# Patient Record
Sex: Male | Born: 1955 | Race: Black or African American | Hispanic: No | Marital: Married | Smoking: Former smoker
Health system: Southern US, Community
[De-identification: ages and names within clinical notes are randomized; demographics above are authoritative.]

## PROBLEM LIST (undated history)

## (undated) DIAGNOSIS — I219 Acute myocardial infarction, unspecified: Secondary | ICD-10-CM

## (undated) DIAGNOSIS — I739 Peripheral vascular disease, unspecified: Secondary | ICD-10-CM

## (undated) DIAGNOSIS — I2510 Atherosclerotic heart disease of native coronary artery without angina pectoris: Secondary | ICD-10-CM

## (undated) DIAGNOSIS — E785 Hyperlipidemia, unspecified: Secondary | ICD-10-CM

## (undated) DIAGNOSIS — G629 Polyneuropathy, unspecified: Secondary | ICD-10-CM

## (undated) DIAGNOSIS — E119 Type 2 diabetes mellitus without complications: Secondary | ICD-10-CM

## (undated) DIAGNOSIS — I10 Essential (primary) hypertension: Secondary | ICD-10-CM

## (undated) HISTORY — PX: VASCULAR SURGERY: SHX849

## (undated) HISTORY — PX: OTHER SURGICAL HISTORY: SHX169

---

## 1994-08-14 DIAGNOSIS — I219 Acute myocardial infarction, unspecified: Secondary | ICD-10-CM

## 1994-08-14 HISTORY — DX: Acute myocardial infarction, unspecified: I21.9

## 2007-03-18 ENCOUNTER — Emergency Department: Payer: Self-pay | Admitting: Emergency Medicine

## 2007-06-12 ENCOUNTER — Ambulatory Visit: Payer: Self-pay | Admitting: Vascular Surgery

## 2008-08-24 ENCOUNTER — Ambulatory Visit: Payer: Self-pay | Admitting: Vascular Surgery

## 2010-10-03 ENCOUNTER — Ambulatory Visit: Payer: Self-pay | Admitting: Vascular Surgery

## 2013-07-22 ENCOUNTER — Ambulatory Visit: Admit: 2013-07-22 | Disposition: A | Discharge status: Home / Self Care | Payer: Self-pay | Admitting: Gastroenterology

## 2013-08-06 ENCOUNTER — Ambulatory Visit: Payer: Self-pay | Admitting: Cardiology

## 2014-02-24 ENCOUNTER — Ambulatory Visit: Payer: Self-pay

## 2014-03-05 ENCOUNTER — Ambulatory Visit: Payer: Self-pay | Admitting: Vascular Surgery

## 2014-03-05 LAB — BASIC METABOLIC PANEL
Anion Gap: 6 — ABNORMAL LOW (ref 7–16)
BUN: 28 mg/dL — ABNORMAL HIGH (ref 7–18)
Calcium, Total: 9.6 mg/dL (ref 8.5–10.1)
Chloride: 100 mmol/L (ref 98–107)
Co2: 31 mmol/L (ref 21–32)
Creatinine: 1.4 mg/dL — ABNORMAL HIGH (ref 0.60–1.30)
GFR CALC NON AF AMER: 55 — AB
GLUCOSE: 94 mg/dL (ref 65–99)
Osmolality: 279 (ref 275–301)
Potassium: 3.9 mmol/L (ref 3.5–5.1)
Sodium: 137 mmol/L (ref 136–145)

## 2014-03-19 ENCOUNTER — Ambulatory Visit: Payer: Self-pay | Admitting: Vascular Surgery

## 2014-03-19 LAB — BASIC METABOLIC PANEL
Anion Gap: 9 (ref 7–16)
BUN: 21 mg/dL — ABNORMAL HIGH (ref 7–18)
CALCIUM: 8.9 mg/dL (ref 8.5–10.1)
CO2: 27 mmol/L (ref 21–32)
CREATININE: 1.05 mg/dL (ref 0.60–1.30)
Chloride: 103 mmol/L (ref 98–107)
EGFR (African American): >60
Glucose: 104 mg/dL — ABNORMAL HIGH (ref 65–99)
Osmolality: 281 (ref 275–301)
POTASSIUM: 4 mmol/L (ref 3.5–5.1)
Sodium: 139 mmol/L (ref 136–145)

## 2014-03-20 LAB — BASIC METABOLIC PANEL
Anion Gap: 8 (ref 7–16)
BUN: 18 mg/dL (ref 7–18)
CALCIUM: 8.4 mg/dL — AB (ref 8.5–10.1)
Chloride: 104 mmol/L (ref 98–107)
Co2: 27 mmol/L (ref 21–32)
Creatinine: 1.01 mg/dL (ref 0.60–1.30)
EGFR (African American): >60
Glucose: 103 mg/dL — ABNORMAL HIGH (ref 65–99)
Osmolality: 280 (ref 275–301)
Potassium: 3.9 mmol/L (ref 3.5–5.1)
Sodium: 139 mmol/L (ref 136–145)

## 2014-03-20 LAB — CBC WITH DIFFERENTIAL/PLATELET
BASOS PCT: 0.4 %
Basophil #: 0 10*3/uL (ref 0.0–0.1)
Eosinophil #: 0.4 10*3/uL (ref 0.0–0.7)
Eosinophil %: 4 %
HCT: 38.4 % — ABNORMAL LOW (ref 40.0–52.0)
HGB: 12.4 g/dL — ABNORMAL LOW (ref 13.0–18.0)
LYMPHS PCT: 14.1 %
Lymphocyte #: 1.2 10*3/uL (ref 1.0–3.6)
MCH: 26.9 pg (ref 26.0–34.0)
MCHC: 32.3 g/dL (ref 32.0–36.0)
MCV: 84 fL (ref 80–100)
Monocyte #: 0.7 x10 3/mm (ref 0.2–1.0)
Monocyte %: 8.6 %
NEUTROS ABS: 6.4 10*3/uL (ref 1.4–6.5)
NEUTROS PCT: 72.9 %
PLATELETS: 320 10*3/uL (ref 150–440)
RBC: 4.6 10*6/uL (ref 4.40–5.90)
RDW: 22.4 % — AB (ref 11.5–14.5)
WBC: 8.7 10*3/uL (ref 3.8–10.6)

## 2014-09-07 ENCOUNTER — Ambulatory Visit: Payer: Self-pay | Admitting: Vascular Surgery

## 2014-09-07 LAB — BASIC METABOLIC PANEL
Anion Gap: 5 — ABNORMAL LOW (ref 7–16)
BUN: 21 mg/dL — AB (ref 7–18)
CHLORIDE: 102 mmol/L (ref 98–107)
CREATININE: 1.23 mg/dL (ref 0.60–1.30)
Calcium, Total: 9.7 mg/dL (ref 8.5–10.1)
Co2: 33 mmol/L — ABNORMAL HIGH (ref 21–32)
EGFR (African American): >60
GLUCOSE: 103 mg/dL — AB (ref 65–99)
OSMOLALITY: 283 (ref 275–301)
Potassium: 4.5 mmol/L (ref 3.5–5.1)
Sodium: 140 mmol/L (ref 136–145)

## 2014-09-22 ENCOUNTER — Ambulatory Visit: Payer: Self-pay | Admitting: Vascular Surgery

## 2014-09-30 ENCOUNTER — Inpatient Hospital Stay: Payer: Self-pay | Admitting: Vascular Surgery

## 2014-11-19 ENCOUNTER — Inpatient Hospital Stay
Admit: 2014-11-19 | Disposition: A | Discharge status: Home / Self Care | Payer: Self-pay | Attending: Vascular Surgery | Admitting: Vascular Surgery

## 2014-11-19 LAB — BASIC METABOLIC PANEL
ANION GAP: 9 (ref 7–16)
BUN: 14 mg/dL
CHLORIDE: 102 mmol/L
Calcium, Total: 9.8 mg/dL
Co2: 27 mmol/L
Creatinine: 1.13 mg/dL
EGFR (Non-African Amer.): >60
GLUCOSE: 96 mg/dL
POTASSIUM: 4 mmol/L
Sodium: 138 mmol/L

## 2014-11-19 LAB — CBC
HCT: 46.7 % (ref 40.0–52.0)
HGB: 14.9 g/dL (ref 13.0–18.0)
MCH: 28.5 pg (ref 26.0–34.0)
MCHC: 31.9 g/dL — ABNORMAL LOW (ref 32.0–36.0)
MCV: 89 fL (ref 80–100)
PLATELETS: 339 10*3/uL (ref 150–440)
RBC: 5.24 10*6/uL (ref 4.40–5.90)
RDW: 14.3 % (ref 11.5–14.5)
WBC: 9.9 10*3/uL (ref 3.8–10.6)

## 2014-11-19 LAB — PROTIME-INR
INR: 1.4
Prothrombin Time: 17.7 secs — ABNORMAL HIGH

## 2014-11-19 LAB — APTT: Activated PTT: 46.3 secs — ABNORMAL HIGH (ref 23.6–35.9)

## 2014-11-20 LAB — CBC WITH DIFFERENTIAL/PLATELET
BASOS ABS: 0.1 10*3/uL (ref 0.0–0.1)
Basophil %: 0.7 %
EOS ABS: 0.3 10*3/uL (ref 0.0–0.7)
Eosinophil %: 3.5 %
HCT: 41.4 % (ref 40.0–52.0)
HGB: 13.3 g/dL (ref 13.0–18.0)
LYMPHS ABS: 2.5 10*3/uL (ref 1.0–3.6)
Lymphocyte %: 27.9 %
MCH: 28.6 pg (ref 26.0–34.0)
MCHC: 32.2 g/dL (ref 32.0–36.0)
MCV: 89 fL (ref 80–100)
Monocyte #: 0.9 x10 3/mm (ref 0.2–1.0)
Monocyte %: 9.4 %
NEUTROS ABS: 5.3 10*3/uL (ref 1.4–6.5)
Neutrophil %: 58.5 %
Platelet: 282 10*3/uL (ref 150–440)
RBC: 4.66 10*6/uL (ref 4.40–5.90)
RDW: 14.3 % (ref 11.5–14.5)
WBC: 9.1 10*3/uL (ref 3.8–10.6)

## 2014-11-20 LAB — HEPARIN LEVEL (UNFRACTIONATED): Anti-Xa(Unfractionated): >1.1 IU/mL (ref 0.30–0.70)

## 2014-11-20 LAB — APTT: Activated PTT: 96.6 secs — ABNORMAL HIGH (ref 23.6–35.9)

## 2014-11-21 LAB — CBC WITH DIFFERENTIAL/PLATELET
BASOS PCT: 0.1 %
Basophil #: 0 10*3/uL (ref 0.0–0.1)
EOS PCT: 4.4 %
Eosinophil #: 0.4 10*3/uL (ref 0.0–0.7)
HCT: 37.7 % — AB (ref 40.0–52.0)
HGB: 11.9 g/dL — ABNORMAL LOW (ref 13.0–18.0)
Lymphocyte #: 1.5 10*3/uL (ref 1.0–3.6)
Lymphocyte %: 18.5 %
MCH: 28.1 pg (ref 26.0–34.0)
MCHC: 31.5 g/dL — ABNORMAL LOW (ref 32.0–36.0)
MCV: 89 fL (ref 80–100)
MONO ABS: 0.6 x10 3/mm (ref 0.2–1.0)
Monocyte %: 7.9 %
Neutrophil #: 5.6 10*3/uL (ref 1.4–6.5)
Neutrophil %: 69.1 %
Platelet: 244 10*3/uL (ref 150–440)
RBC: 4.22 10*6/uL — ABNORMAL LOW (ref 4.40–5.90)
RDW: 14.2 % (ref 11.5–14.5)
WBC: 8.2 10*3/uL (ref 3.8–10.6)

## 2014-11-21 LAB — HEPARIN LEVEL (UNFRACTIONATED)
Anti-Xa(Unfractionated): 0.51 IU/mL (ref 0.30–0.70)
Anti-Xa(Unfractionated): 0.52 IU/mL (ref 0.30–0.70)

## 2014-11-21 LAB — APTT: Activated PTT: 119.3 secs — ABNORMAL HIGH (ref 23.6–35.9)

## 2014-11-22 LAB — BASIC METABOLIC PANEL
Anion Gap: 3 — ABNORMAL LOW (ref 7–16)
BUN: 8 mg/dL
CREATININE: 0.83 mg/dL
Calcium, Total: 8.7 mg/dL — ABNORMAL LOW
Chloride: 109 mmol/L
Co2: 28 mmol/L
EGFR (African American): >60
EGFR (Non-African Amer.): >60
GLUCOSE: 102 mg/dL — AB
POTASSIUM: 3.6 mmol/L
SODIUM: 140 mmol/L

## 2014-11-22 LAB — CBC WITH DIFFERENTIAL/PLATELET
BASOS PCT: 0.3 %
Basophil #: 0 10*3/uL (ref 0.0–0.1)
Eosinophil #: 0.4 10*3/uL (ref 0.0–0.7)
Eosinophil %: 5.8 %
HCT: 35.5 % — AB (ref 40.0–52.0)
HGB: 11.4 g/dL — AB (ref 13.0–18.0)
LYMPHS ABS: 1.8 10*3/uL (ref 1.0–3.6)
Lymphocyte %: 26.9 %
MCH: 28.5 pg (ref 26.0–34.0)
MCHC: 32.1 g/dL (ref 32.0–36.0)
MCV: 89 fL (ref 80–100)
MONO ABS: 0.7 x10 3/mm (ref 0.2–1.0)
Monocyte %: 10.9 %
Neutrophil #: 3.7 10*3/uL (ref 1.4–6.5)
Neutrophil %: 56.1 %
Platelet: 227 10*3/uL (ref 150–440)
RBC: 4.01 10*6/uL — AB (ref 4.40–5.90)
RDW: 14 % (ref 11.5–14.5)
WBC: 6.7 10*3/uL (ref 3.8–10.6)

## 2014-11-22 LAB — HEPARIN LEVEL (UNFRACTIONATED)
ANTI-XA(UNFRACTIONATED): 0.64 [IU]/mL (ref 0.30–0.70)
Anti-Xa(Unfractionated): 0.87 IU/mL — ABNORMAL HIGH (ref 0.30–0.70)

## 2014-11-24 ENCOUNTER — Ambulatory Visit
Admit: 2014-11-24 | Disposition: A | Discharge status: Home / Self Care | Payer: Self-pay | Attending: Vascular Surgery | Admitting: Vascular Surgery

## 2014-11-24 LAB — MRSA PCR SCREENING

## 2014-11-24 LAB — PROTIME-INR
INR: 1
Prothrombin Time: 13.8 secs

## 2014-11-24 LAB — APTT: ACTIVATED PTT: 38.7 s — AB (ref 23.6–35.9)

## 2014-11-24 LAB — HEMOGLOBIN: HGB: 12.7 g/dL — AB (ref 13.0–18.0)

## 2014-12-02 ENCOUNTER — Inpatient Hospital Stay
Admit: 2014-12-02 | Disposition: A | Discharge status: Home / Self Care | Payer: Self-pay | Attending: Vascular Surgery | Admitting: Vascular Surgery

## 2014-12-03 LAB — CBC WITH DIFFERENTIAL/PLATELET
BASOS ABS: 0 10*3/uL (ref 0.0–0.1)
Basophil %: 0.4 %
EOS ABS: 0 10*3/uL (ref 0.0–0.7)
Eosinophil %: 0.3 %
HCT: 38.9 % — ABNORMAL LOW (ref 40.0–52.0)
HGB: 12.2 g/dL — ABNORMAL LOW (ref 13.0–18.0)
Lymphocyte #: 1.2 10*3/uL (ref 1.0–3.6)
Lymphocyte %: 9.7 %
MCH: 27.8 pg (ref 26.0–34.0)
MCHC: 31.3 g/dL — ABNORMAL LOW (ref 32.0–36.0)
MCV: 89 fL (ref 80–100)
Monocyte #: 1 x10 3/mm (ref 0.2–1.0)
Monocyte %: 8.1 %
NEUTROS PCT: 81.5 %
Neutrophil #: 10.2 10*3/uL — ABNORMAL HIGH (ref 1.4–6.5)
PLATELETS: 314 10*3/uL (ref 150–440)
RBC: 4.38 10*6/uL — ABNORMAL LOW (ref 4.40–5.90)
RDW: 13.8 % (ref 11.5–14.5)
WBC: 12.5 10*3/uL — ABNORMAL HIGH (ref 3.8–10.6)

## 2014-12-03 LAB — BASIC METABOLIC PANEL
Anion Gap: 9 (ref 7–16)
BUN: 16 mg/dL
CO2: 21 mmol/L — AB
CREATININE: 0.74 mg/dL
Calcium, Total: 8.6 mg/dL — ABNORMAL LOW
Chloride: 109 mmol/L
EGFR (Non-African Amer.): >60
GLUCOSE: 135 mg/dL — AB
Potassium: 3.8 mmol/L
SODIUM: 139 mmol/L

## 2014-12-03 LAB — PROTIME-INR
INR: 1.1
PROTHROMBIN TIME: 14.4 s

## 2014-12-03 LAB — APTT: Activated PTT: 31.5 secs (ref 23.6–35.9)

## 2014-12-04 LAB — CBC WITH DIFFERENTIAL/PLATELET
BASOS PCT: 0.3 %
Basophil #: 0 10*3/uL (ref 0.0–0.1)
EOS PCT: 0.8 %
Eosinophil #: 0.1 10*3/uL (ref 0.0–0.7)
HCT: 34.7 % — ABNORMAL LOW (ref 40.0–52.0)
HGB: 11.1 g/dL — AB (ref 13.0–18.0)
Lymphocyte #: 1.1 10*3/uL (ref 1.0–3.6)
Lymphocyte %: 10 %
MCH: 28 pg (ref 26.0–34.0)
MCHC: 32.1 g/dL (ref 32.0–36.0)
MCV: 87 fL (ref 80–100)
MONO ABS: 1.2 x10 3/mm — AB (ref 0.2–1.0)
MONOS PCT: 10.3 %
NEUTROS PCT: 78.6 %
Neutrophil #: 8.8 10*3/uL — ABNORMAL HIGH (ref 1.4–6.5)
Platelet: 315 10*3/uL (ref 150–440)
RBC: 3.99 10*6/uL — ABNORMAL LOW (ref 4.40–5.90)
RDW: 14.1 % (ref 11.5–14.5)
WBC: 11.2 10*3/uL — AB (ref 3.8–10.6)

## 2014-12-04 LAB — BASIC METABOLIC PANEL
ANION GAP: 7 (ref 7–16)
BUN: 14 mg/dL
CREATININE: 0.68 mg/dL
Calcium, Total: 8.8 mg/dL — ABNORMAL LOW
Chloride: 106 mmol/L
Co2: 27 mmol/L
EGFR (Non-African Amer.): >60
Glucose: 114 mg/dL — ABNORMAL HIGH
Potassium: 3.5 mmol/L
SODIUM: 140 mmol/L

## 2014-12-05 LAB — CBC WITH DIFFERENTIAL/PLATELET
BASOS PCT: 0.3 %
Basophil #: 0 10*3/uL (ref 0.0–0.1)
EOS PCT: 3.1 %
Eosinophil #: 0.4 10*3/uL (ref 0.0–0.7)
HCT: 33.3 % — ABNORMAL LOW (ref 40.0–52.0)
HGB: 10.8 g/dL — ABNORMAL LOW (ref 13.0–18.0)
LYMPHS ABS: 1.7 10*3/uL (ref 1.0–3.6)
Lymphocyte %: 14.3 %
MCH: 28.4 pg (ref 26.0–34.0)
MCHC: 32.4 g/dL (ref 32.0–36.0)
MCV: 87 fL (ref 80–100)
Monocyte #: 1 x10 3/mm (ref 0.2–1.0)
Monocyte %: 8.5 %
NEUTROS PCT: 73.8 %
Neutrophil #: 8.6 10*3/uL — ABNORMAL HIGH (ref 1.4–6.5)
Platelet: 332 10*3/uL (ref 150–440)
RBC: 3.81 10*6/uL — AB (ref 4.40–5.90)
RDW: 14 % (ref 11.5–14.5)
WBC: 11.6 10*3/uL — AB (ref 3.8–10.6)

## 2014-12-05 LAB — BASIC METABOLIC PANEL
Anion Gap: 8 (ref 7–16)
BUN: 21 mg/dL — ABNORMAL HIGH
CALCIUM: 8.6 mg/dL — AB
Chloride: 101 mmol/L
Co2: 27 mmol/L
Creatinine: 0.76 mg/dL
EGFR (African American): >60
Glucose: 96 mg/dL
Potassium: 3.5 mmol/L
Sodium: 136 mmol/L

## 2014-12-05 LAB — HEMOGLOBIN A1C: HEMOGLOBIN A1C: 5.6 %

## 2014-12-05 NOTE — Op Note (Signed)
PATIENT NAME:  Wayne, [name] MR#:  [id number] DATE OF BIRTH:  June 22, 1956  DATE OF PROCEDURE:  03/19/2014  PREOPERATIVE DIAGNOSES:  1. Acute on chronic right lower extremity ischemia with reocclusion of recent intervention.  2. Peripheral arterial disease with ulceration, right lower extremity.  3. Diabetes.  4. Hypertension.   POSTOPERATIVE DIAGNOSES: 1. Acute on chronic right lower extremity ischemia with reocclusion of recent intervention.  2. Peripheral arterial disease with ulceration, right lower extremity.  3. Diabetes.  4. Hypertension.   PROCEDURES:  1. Ultrasound guidance for vascular access to the left femoral artery.  2. Catheter placement of the right anterior tibial and right posterior tibial arteries from the left femoral approach.  3. Selective right lower extremity angiogram.  4. Catheter directed thrombolysis with 8 mg of tPA to the right superficial femoral artery, popliteal, tibioperoneal trunk, and posterior tibial arteries.  5. Mechanical rheolytic thrombectomy to the same arteries.  6. Percutaneous transluminal angioplasty of tibioperoneal trunk and posterior tibial artery with 4-mm diameter drug-coated angioplasty balloon.  7. Percutaneous transluminal angioplasty of right anterior tibial artery with 3-mm diameter angioplasty balloon.  8. Percutaneous transluminal angioplasty of popliteal artery and entire superficial femoral artery with 6-mm diameter angioplasty balloon.  9. Viabahn stent placement to the popliteal artery and superficial femoral artery for residual stenosis and thrombosis after above procedures.  10. Viabahn stent placement to the mid and proximal superficial femoral artery x 2 for residual stenosis and thrombosis after above procedures.  11. StarClose closure device, left femoral artery.   SURGEON: Dr. Wyn Quaker.   ANESTHESIA: Local with moderate conscious sedation.   ESTIMATED BLOOD LOSS: Minimal.   INDICATION FOR PROCEDURE: [name]  with multiple issues. He has severe PAD and has undergone previous revascularizations. He presents with reocclusion of a recent revascularization with ischemic rest pain and ulceration of the right lower extremity. We are trying to continue with an attempt at limb salvage. We are going to open try to open up his lower extremity vessels and fully anticoagulate him. Risks and benefits were discussed. Informed consent was obtained.   DESCRIPTION OF PROCEDURE: The patient was brought to the vascular suite. Groins were shaved and prepped and a sterile surgical field was created. He has a known left common femoral artery occlusion. We stuck it just at the top of this occlusion with a micropuncture needle and micropuncture wire and sheath were then placed. We upsized to a 5 [place] sheath and crossed the aortic bifurcation without difficulty, advanced to the right femoral head and selective right lower extremity angiogram was then performed. This showed occlusion of the superficial femoral artery about 2 cm beyond its origin with really no reconstitution until the tibial vessels with both the anterior and posterior tibial arteries reconstitute distally. I then heparinized the patient. A 6 French Ansell sheath was placed over a [employer] wire. Crossing the reocclusion was quite simple and an advantage wire and a Kumpe catheter were advanced into the posterior tibial artery without difficulty. I treated this initially with 8 mg of tPA from the origin of the right superficial femoral artery throughout the popliteal artery down to the tibioperoneal trunk and posterior tibial arteries. This was allowed to dwell for about 10 minutes. Mechanical rheolytic thrombectomy was then performed over the same vessels from the posterior tibial artery, tibioperoneal trunk, popliteal artery, and superficial femoral artery. Despite this, there was still residual stenosis and thrombosis. The tibioperoneal trunk and posterior tibial  arteries were treated with a 4-mm diameter  Lutonix drug-coated angioplasty balloon. The proximal SFA was treated with a 6-mm diameter Lutonix drug-coated angioplasty balloon. The remainder of the SFA and popliteal were treated with 6-mm diameter angioplasty balloon. Following this, there was still significant residual thrombus and stenosis. The proximal SFA had a dissection with thrombus and stenosis about 3 or 4 cm beyond the origin of the SFA. There was a stent fracture in the mid SFA with residual stenosis and dissection at this location and the popliteal artery had significant residual stenosis and dissection. Despite this, there was still significant residual areas with the proximal SFA lesion in the mid SFA stent fracture and in the popliteal with multiple separate and distinct areas of the long segments and thrombus intervening, I elected to cover the entire area with Viabahn covered stents. We went from just below the knee in the popliteal artery to the mid SFA with the original Viabahn stent. A second and then a third Viabahn stent were used to cover the mid and proximal SFA and in the proximal SFA up to about 2 cm beyond the origin of the SFA. This was postdilated with a 6-mm balloon with markedly improved flow. At this point, the anterior tibial artery had significant flow limitation in the proximal segment from stenosis and likely thrombus as well. I was able to use a Kumpe catheter and get a 0.018 wire down in the anterior tibial artery and treat this with a 3-mm diameter angioplasty balloon with significantly improved flow. At this point, I felt all we could do was anticoagulate the patient and we began an Aggrastat drip. The StarClose closure device was deployed in the usual fashion in the left femoral artery with excellent hemostatic result. The patient tolerated the procedure well and was taken to the recovery room in stable condition.    ____________________________ Annice NeedyJason S. Gonzalo Waymire,  MD jsd:lt D: 03/19/2014 15:01:37 ET T: 03/19/2014 20:27:12 ET JOB#: [id number]  cc: Annice NeedyJason S. Evolet Salminen, MD, <Dictator> Annice NeedyJASON S Ski Polich MD ELECTRONICALLY SIGNED 03/25/2014 14:27

## 2014-12-05 NOTE — Op Note (Signed)
PATIENT NAME:  Wayne [name], Wayne [name] MR#:  [id number] DATE OF BIRTH:  16-Oct-1955  DATE OF PROCEDURE:  03/05/2014  PREOPERATIVE DIAGNOSES:  1.  Peripheral artery disease with ulceration, right lower extremity.  2.  Diabetes.  3.  Hypertension.   POSTOPERATIVE DIAGNOSES: 1.  Peripheral artery disease with ulceration, right lower extremity.  2.  Diabetes.  3.  Hypertension.   PROCEDURES PERFORMED: 1.  Ultrasound guidance for vascular access to left femoral artery.  2.  Catheter placement to right posterior tibial artery from left femoral approach.  3.  Aortogram and selective right lower extremity angiogram.  4.  Percutaneous transluminal angioplasty of right tibioperoneal trunk and posterior tibial artery with 3 mm diameter conventional and 4 mm diameter drug-coated angioplasty balloon.  5.  Percutaneous transluminal angioplasty of right popliteal artery and distal SFA with 4 mm diameter drug-coated angioplasty balloon.  6.  Percutaneous transluminal angioplasty of right superficial femoral artery in the mid and proximal segments with 4 and 5 mm diameter drug-coated angioplasty balloons.  7.  Percutaneous transluminal angioplasty of right proximal superficial femoral artery to common femoral artery with 4 mm diameter drug-coated angioplasty balloon and 5 mm diameter conventional angioplasty balloon.  8.  Self-expanding stent placement x2 to the proximal to mid superficial femoral artery for greater than 50% residual stenosis and dissection after angioplasty.  9.  StarClose closure device, right femoral artery.   SURGEON: Annice Needy, M.D.   ANESTHESIA: Local with moderate conscious sedation.   ESTIMATED BLOOD LOSS: 25 mL.   INDICATION FOR PROCEDURE: This is a 59 year old gentleman who has extensive history of peripheral vascular disease. I have previously treated him in the past for this and he returns now with a nonhealing ulceration of the right foot. His ABIs dropped to the 0.4 range. He has this  nonhealing ulceration and we discussed this is a critical limb threatening situation and recommended revascularization. Risks and benefits were discussed and informed consent was obtained.   DESCRIPTION OF PROCEDURE: The patient is brought to the vascular suite. The groins were shaved and prepped and a sterile surgical field was created. The left femoral head was localized with fluoroscopy. 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 Under ultrasound guidance, I accessed just above the occlusion, in the proximal common femoral artery to distal external iliac artery, without difficulty with a micropuncture needle and micropuncture wire and sheath were placed and I upsized to a 5-French sheath. A pigtail catheter was placed into the aorta at the L1-L2 level and AP aortogram was performed. This showed widely patent renal arteries. The aorta was patent without flow-limiting stenosis. The iliac segments did not have flow-limiting stenosis. I then crossed the aortic bifurcation and advanced to the right femoral head and selective right lower extremity angiogram was then performed. This demonstrated a proximal SFA stenosis that was about 90%. The artery then normalized for several centimeters and there was an occlusion of the SFA. This had slight reconstitution in the popliteal artery, but then the popliteal artery again was occluded, and he then reconstituted down in the anterior tibial artery and the posterior tibial artery. The anterior tibial artery was continuous and was likely the best vessel to the foot, and the posterior tibial artery had separate stenosis in its proximal portion, but then was a good vessel continuous to the foot distally. The patient was systemically heparinized. A 6-French Ansell sheath was placed over a [employer] wire. I began to work. The initial  SFA stenosis was easily crossed. However, the SFA occlusion was a little more complex. I had to exchange  for a CXI catheter to get through the SFA and re-enter in the popliteal artery, again reaching another occlusion and advancing down into the tibioperoneal trunk region. In this area, I was able to cross the stenosis, in the posterior tibial artery, and confirm intraluminal flow in the distal posterior tibial artery. At this point, I exchanged for an 0.018 wire, treated the posterior tibial stenosis and tibioperoneal trunk and distal popliteal artery with a 3 mm diameter angioplasty balloon with a suboptimal result. He had generous vessels down here and I then exchanged for a 4 mm diameter Lutonix drug-coated angioplasty balloon in the proximal posterior tibial artery, tibioperoneal trunk and distal popliteal artery. A 4 mm diameter angioplasty balloon was also used to treat the popliteal artery at and above the knee all the way up through the SFA. The mid to distal SFA and above-knee popliteal artery were treated with a 5 mm diameter drug-coated angioplasty balloon with a good angiographic completion result. The posterior tibial artery and tibioperoneal trunk also had a good result to angioplasty. The proximal SFA stenosis and the leading edge of the initial occlusion were treated first with a 4 mm diameter Lutonix drug-coated angioplasty balloon and then a 5 mm diameter conventional angioplasty balloon. The initial SFA stenosis responded well to angioplasty. However, the leading edge of the SFA occlusion, in the proximal and mid SFA, had residual dissection and stenosis of greater than 50%. I initially placed a 6 x 10 self-expanding stent. However, there was still residual dissection proximal to this and an additional 6 x 6 self-expanding stent was placed up into the proximal superficial artery, still about 5 cm beyond the origin of the SFA. At this point, they were post dilated with a 5 mm balloon with an excellent angiographic completion result with markedly improved flow distally, and I elected to terminate the  procedure. StarClose closure device was deployed on the left with excellent hemostatic result. The patient tolerated the procedure well and was taken to the recovery room in stable condition.   ____________________________ Annice NeedyJason S. Hanford Lust, MD jsd:sb D: 03/05/2014 10:56:44 ET T: 03/05/2014 11:05:56 ET JOB#: [id number]  cc: Annice NeedyJason S. Eliakim Tendler, MD, <Dictator> Annice NeedyJASON S Elen Acero MD ELECTRONICALLY SIGNED 03/24/2014 14:09

## 2014-12-07 LAB — CBC WITH DIFFERENTIAL/PLATELET
Basophil #: 0.1 10*3/uL (ref 0.0–0.1)
Basophil %: 0.5 %
EOS ABS: 0.7 10*3/uL (ref 0.0–0.7)
Eosinophil %: 6.1 %
HCT: 37.1 % — AB (ref 40.0–52.0)
HGB: 12.1 g/dL — ABNORMAL LOW (ref 13.0–18.0)
LYMPHS ABS: 1.9 10*3/uL (ref 1.0–3.6)
Lymphocyte %: 16.8 %
MCH: 28 pg (ref 26.0–34.0)
MCHC: 32.5 g/dL (ref 32.0–36.0)
MCV: 86 fL (ref 80–100)
Monocyte #: 1.1 x10 3/mm — ABNORMAL HIGH (ref 0.2–1.0)
Monocyte %: 9.8 %
Neutrophil #: 7.7 10*3/uL — ABNORMAL HIGH (ref 1.4–6.5)
Neutrophil %: 66.8 %
PLATELETS: 501 10*3/uL — AB (ref 150–440)
RBC: 4.3 10*6/uL — ABNORMAL LOW (ref 4.40–5.90)
RDW: 13.5 % (ref 11.5–14.5)
WBC: 11.5 10*3/uL — AB (ref 3.8–10.6)

## 2014-12-07 LAB — BASIC METABOLIC PANEL
Anion Gap: 10 (ref 7–16)
BUN: 27 mg/dL — ABNORMAL HIGH
CO2: 28 mmol/L
Calcium, Total: 9.1 mg/dL
Chloride: 99 mmol/L — ABNORMAL LOW
Creatinine: 0.88 mg/dL
EGFR (Non-African Amer.): >60
GLUCOSE: 113 mg/dL — AB
POTASSIUM: 3.8 mmol/L
Sodium: 137 mmol/L

## 2014-12-07 LAB — SURGICAL PATHOLOGY

## 2014-12-08 LAB — BASIC METABOLIC PANEL
Anion Gap: 6 — ABNORMAL LOW (ref 7–16)
BUN: 22 mg/dL — ABNORMAL HIGH
CO2: 28 mmol/L
Calcium, Total: 8.6 mg/dL — ABNORMAL LOW
Chloride: 103 mmol/L
Creatinine: 0.83 mg/dL
EGFR (African American): >60
EGFR (Non-African Amer.): >60
Glucose: 94 mg/dL
POTASSIUM: 3.7 mmol/L
SODIUM: 137 mmol/L

## 2014-12-10 LAB — CBC WITH DIFFERENTIAL/PLATELET
BASOS ABS: 0 10*3/uL (ref 0.0–0.1)
BASOS ABS: 0.1 10*3/uL (ref 0.0–0.1)
BASOS PCT: 0.4 %
Basophil %: 0.6 %
Eosinophil #: 0.5 10*3/uL (ref 0.0–0.7)
Eosinophil #: 0.6 10*3/uL (ref 0.0–0.7)
Eosinophil %: 4.6 %
Eosinophil %: 5.3 %
HCT: 35.5 % — AB (ref 40.0–52.0)
HCT: 37.1 % — AB (ref 40.0–52.0)
HGB: 11.7 g/dL — ABNORMAL LOW (ref 13.0–18.0)
HGB: 11.9 g/dL — AB (ref 13.0–18.0)
Lymphocyte #: 2 10*3/uL (ref 1.0–3.6)
Lymphocyte #: 2 10*3/uL (ref 1.0–3.6)
Lymphocyte %: 18.5 %
Lymphocyte %: 19.4 %
MCH: 27.9 pg (ref 26.0–34.0)
MCH: 28.7 pg (ref 26.0–34.0)
MCHC: 33 g/dL (ref 32.0–36.0)
MCHC: 32 g/dL (ref 32.0–36.0)
MCV: 87 fL (ref 80–100)
MCV: 87 fL (ref 80–100)
MONO ABS: 1.2 x10 3/mm — AB (ref 0.2–1.0)
MONOS PCT: 10 %
Monocyte #: 1.1 x10 3/mm — ABNORMAL HIGH (ref 0.2–1.0)
Monocyte %: 10.9 %
NEUTROS ABS: 6.8 10*3/uL — AB (ref 1.4–6.5)
NEUTROS ABS: 7.2 10*3/uL — AB (ref 1.4–6.5)
NEUTROS PCT: 65.6 %
Neutrophil %: 64.7 %
PLATELETS: 487 10*3/uL — AB (ref 150–440)
Platelet: 480 10*3/uL — ABNORMAL HIGH (ref 150–440)
RBC: 4.08 10*6/uL — ABNORMAL LOW (ref 4.40–5.90)
RBC: 4.26 10*6/uL — AB (ref 4.40–5.90)
RDW: 13.7 % (ref 11.5–14.5)
RDW: 13.7 % (ref 11.5–14.5)
WBC: 10.6 10*3/uL (ref 3.8–10.6)
WBC: 11 10*3/uL — ABNORMAL HIGH (ref 3.8–10.6)

## 2014-12-10 LAB — BASIC METABOLIC PANEL
ANION GAP: 7 (ref 7–16)
ANION GAP: 7 (ref 7–16)
BUN: 13 mg/dL
BUN: 14 mg/dL
CALCIUM: 9.1 mg/dL
CHLORIDE: 101 mmol/L
CO2: 28 mmol/L
Calcium, Total: 9.2 mg/dL
Chloride: 101 mmol/L
Co2: 27 mmol/L
Creatinine: 0.87 mg/dL
Creatinine: 0.88 mg/dL
EGFR (Non-African Amer.): >60
Glucose: 105 mg/dL — ABNORMAL HIGH
Glucose: 99 mg/dL
Potassium: 4.2 mmol/L
Potassium: 4.2 mmol/L
Sodium: 135 mmol/L
Sodium: 136 mmol/L

## 2014-12-12 LAB — WOUND CULTURE

## 2014-12-13 NOTE — Consult Note (Signed)
PATIENT NAME:  Wayne [name], Wayne [name] MR#:  [id number] DATE OF BIRTH:  [birth year]  DATE OF CONSULTATION:  11/20/2014  PRIMARY CARE PHYSICIAN:  Marisue Ivan, MD  REFERRING PHYSICIAN: Renford Dills, MD  REASON FOR CONSULT: Medical management.   HISTORY OF PRESENT ILLNESS: This is a very pleasant 59 year old male with history of peripheral vascular disease, hypertension, diabetes. He was here approximately 6 weeks ago, in which he had extensive vascular reconstruction on his right lower extremity. He presented to the ER on 11/19/2014 with increasing right leg pain. He has had pain about 2 weeks, however, over the past few days, it has worsened. He underwent a CT angiogram today. Hospitalist was consulted for medical management.   REVIEW OF SYSTEMS:  CONSTITUTIONAL: No fever, chills, fatigue, weakness.  EYES: No blurred or double vision.  EARS AND NOSE AND THROAT: No soreness of throat or postnasal drip, snoring.  RESPIRATORY: No cough, wheezing, hemoptysis, or chronic obstructive pulmonary disease.  CARDIOVASCULAR: No chest pain, orthopnea, PND.  GASTROINTESTINAL: No diarrhea, constipation, melena, or ulcers.  GENITOURINARY: No dysuria or hematuria. ENDOCRINE: No  polyuria HEMATOLOGIC/LYMPHATIC: Positive easy bruising.  SKIN: He has pain in the right lower extremity and he says that his wound from his procedure about 6 weeks ago was oozing yesterday.  NEUROLOGICAL: No history of CVA, TIA or seizures. PSYCHOLOGICAL: No history of anxiety or depression.   PAST MEDICAL HISTORY:  1. Peripheral vascular disease, status post extensive vascular reconstruction 6 weeks ago, at which time the femoral artery was cleared and femoral to distal bypass with PTSE was placed. He was started on Xarelto.  2. History of coronary artery disease/MI in 1996.  3. Hyperlipidemia.  4. History of gout.  5. History of hypertension.  6. Type 2 diabetes   MEDICATIONS:  1. Allopurinol 100 mg daily.  2. Aspirin 81 mg  daily.  3. Metformin 500 daily.  4. Coreg 6.25 daily.  5. Xarelto 10 mg daily.  6. Lisinopril 20 mg daily.  7. Simvastatin 20 mg daily.  8. Aldactone 25 mg daily.  9. Acetaminophen oxycodone 5/325, 1 to 2 tablets every 6 hours p.r.n. pain.   FAMILY HISTORY: Positive for pacemaker.   SOCIAL HISTORY:  No tobacco, alcohol or drug use.   PAST SURGICAL HISTORY: Status post distal bypass with PTFE about 6 weeks ago.   ALLERGIES: NO KNOWN DRUG ALLERGIES.   PHYSICAL EXAMINATION:  VITAL SIGNS: Temperature is 97.4, pulse is 99, respirations 18, blood pressure 161/105, 96% on room air.  GENERAL: The patient is alert, oriented, no acute distress.  HEENT:  Head is atraumatic.  Pupils equal.  Sclerae anicteric. Mucous membranes are  moist. Oropharynx is clear.  NECK: Supple. No JVD or carotid bruits. Trachea midline.  RESPIRATORY: Normal respiratory effort. No use of accessory muscles. Clear to auscultation without crackles, rales, rhonchi, or wheezing.  CARDIOVASCULAR: Regular rate and rhythm. No murmurs, gallops, or rubs.  ABDOMEN: Bowel sounds present. Nontender, nondistended. EXTREMITIES: Right leg is cold, fleeting pulse on that right lower extremity. Left leg has 1+ femoral pulse. No rashes. He has a lateral calf incision, not well healed without any purulent discharge.  NEUROLOGIC: Cranial nerves page II through XII are grossly intact. There are no focal deficits.   LABORATORY DATA: White blood cells 9, hemoglobin 13.3, hematocrit 42, platelets are 282,000.   CT angiography on 11/19/2014 showed a recent right common femoral artery occlusion with no flow in the femoral anterior tibial graft and stented superficial femoral and popliteal arteries. The  upper profunda femoris artery is occluded, but reconstitutes and provides infrapopliteal flow via the attenuated anterior tibial artery.   Left-sided bladder wall thickening with contiguous mass. Recommend urography protocol abdominal CT clinically  feasible.   ASSESSMENT AND PLAN: This is a 59 year old male with peripheral vascular disease, status post extensive reconstruction in the right lower extremity about 6 weeks ago, who presents with an ischemic right lower extremity.   1. Ischemic right lower extremity. The patient underwent angiogram as per Dr. Lorretta HarpSchneir. Recommendations for him. The patient is currently on heparin drip, which we will continue. Xarelto has been on hold.  2. Accelerated hypertension. The patient will need good blood pressure control goals, ideally less than 130/80. The patient is on Coreg, as well as lisinopril. I will increase Coreg to 6.25 b.i.d. and monitor the blood pressure and heart rate.   3. Diabetes. I agree with holding metformin for now. The patient will continue on sliding scale insulin.  4. History of coronary artery disease. The patient will be continued on aspirin, beta blocker, statin, lisinopril and Aldactone. He has no chest pain and no symptoms active heart disease at this time.   CODE STATUS the patient is a FULL CODE STATUS.   TIME SPENT: Approximately 45 minutes.  ____________________________ Janyth ContesSital P. Juliene PinaMody, MD spm:AT D: 11/20/2014 13:56:39 ET T: 11/20/2014 14:58:29 ET JOB#: [id number]  cc: Kale Dols P. Juliene PinaMody, MD, <Dictator> Marisue IvanKanhka Linthavong, MD Janyth ContesSITAL P Carisma Troupe MD ELECTRONICALLY SIGNED 11/20/2014 15:55

## 2014-12-13 NOTE — Consult Note (Signed)
Brief Consult Note: Diagnosis: medical management.   Patient was seen by consultant.   Consult note dictated.   Orders entered.   Comments: 58yAAM pmh PVD, HTN, CAD, HLD, DM2, admit 12/02/14 by vasc surgery underwent aortabifemoral bypass, bilateral CFA and PFA thromboendarterectomy, Fogarty embolectomy to right fem-distal bypass ESBL 1200, 650mL returned eval in icu post op c/o pain at surgical site  1. PVD: defer care to primary, pain meds, add bowel reg 2. dm2: hold po, add iss q6h acu 3. htn ess: prn hydralazine, restart home meds when taking PO 4. hld: statin 5. vte px: defer to vasc full 45min.  Electronic Signatures: Riane Rung, Cletis Athensavid K (MD)  (Signed 21-Apr-16 02:00)  Authored: Brief Consult Note   Last Updated: 21-Apr-16 02:00 by Wyatt HasteHower, Twana Wileman K (MD)

## 2014-12-13 NOTE — Op Note (Signed)
PATIENT NAME:  Wayne [name], Wayne [name] MR#:  [id number] DATE OF BIRTH:  Jun 11, 1956  DATE OF PROCEDURE:  11/20/2014  PREOPERATIVE DIAGNOSES:  1.  Ischemia, right lower extremity.  2.  Complication of arterial graft, right leg with thrombosis.  3.  Atherosclerotic occlusive disease, bilateral lower extremities with rest pain.   POSTOPERATIVE DIAGNOSES:  1.  Ischemia, right lower extremity.  2.  Complication of arterial graft, right leg with thrombosis.  3.  Atherosclerotic occlusive disease, bilateral lower extremities with rest pain.  PROCEDURES PERFORMED:   1.  Introduction catheter into aorta via right femoral-to-tibial bypass graft.  2.  Angiography of the abdominal aorta.  3.  Angiography of the right lower extremity.   SURGEON: Renford Dills, MD  ACCESS: A 5 French sheath, right femoral-to-anterior tibial bypass graft.   CONTRAST USED: Isovue 40 mL.   FLUOROSCOPY TIME: 12.3 minutes.   INDICATIONS: Wayne [name] is a 59 year old gentleman who is just 6 weeks status post reconstruction of the right common femoral, profunda femoris with right femoral-to-anterior tibial bypass grafting. He presented to the Emergency Room yesterday with ischemic changes to his foot and was found to have thrombosis of his common femoral artery, origin of the profunda femoris, as well as the bypass graft. The risks and benefits for angiography with the hope for intervention for limb salvage are reviewed. All questions have been answered. The patient has had multiple interventions in the past. He agrees to proceed.   DESCRIPTION OF PROCEDURE: The patient is taken to special procedures and placed in the supine position. After adequate sedation is achieved, both groins are prepped and draped in a sterile fashion and appropriate timeout is called.   Working in the left groin, ultrasound is placed in a sterile sleeve; however, it is very difficult to identify any common femoral artery. There are several large collaterals.  There does appear to be an area very proximally, which would be consistent with proximal common femoral or even distal external iliac. Attempts at accessing this are unsuccessful and after multiple attempts and review of yesterday's CT scan, the left groin is abandoned as possible access.   Therefore, after careful consideration, review CT scan demonstrating that it does not provide adequate imaging, particularly distally, nor of the reconstitution of the profunda femoris, in order, at the very least, to obtain adequate imaging, access will be obtained through the femoral-to-anterior tibial graft; this would provide a more secure place to achieve hemostasis as opposed to through the common femoral itself given the recent surgery and the dense scar tissue.   Therefore, ultrasound was utilized to identify the graft, which was noted to be heterogeneous and filled with thrombus. Access, after 1% lidocaine was infiltrated, was obtained under direct visualization. Image was recorded for the permanent record. A microwire was advanced, micro sheath, followed by an Amplatz wire, and then a 5 [place] sheath.   Using a floppy Glidewire and a Kumpe catheter, the catheter was negotiated into the aorta; however, there was extensive dissection noted throughout, even on initial images where hand injection was performed through the sheath from the bypass graft and, in spite of multiple efforts, true lumen was obtained into the circumflex vessel but could never be renegotiated into the aorta. Given these findings, and the extensive damage and apparent increasing thrombus burden, both in the common and external iliac which is present on CT as well as now on the angiogram, considerations for surgical intervention must be rendered. Therefore, the catheter and wire were removed and  pressure was held after the sheath was removed. There is no change in the patient's status.   INTERPRETATION: The distal aorta demonstrates  opacification, although there appears to be false lumen and a true lumen there is diffuse thrombus and as well as dissection throughout the iliacs, particularly on the right, external, and common. Common femoral appears to have a dissection as well as thrombus noted within it. There is occlusion of the bypass graft. There is nonfilling of the profunda from a femoral injection.   SUMMARY: The patient with profound inflow disease as well as outflow. Possible open reconstruction must be considered; however, given the patient's recent surgical interventions, this would be a high risk procedure and the patient remains at very high risk for amputation.    ____________________________ Renford DillsGregory G. Daryn Pisani, MD ggs:bm D: 11/20/2014 19:09:26 ET T: 11/21/2014 02:09:33 ET JOB#: [id number]  cc: Renford DillsGregory G. Kadeshia Kasparian, MD, <Dictator> Renford DillsGREGORY G Zelta Enfield MD ELECTRONICALLY SIGNED 11/24/2014 15:14

## 2014-12-13 NOTE — Discharge Summary (Signed)
PATIENT NAME:  Wayne [name], Wayne [name] MR#:  [id number] DATE OF BIRTH:  1956-03-10  DATE OF ADMISSION:  09/30/2014 DATE OF DISCHARGE:  10/04/2014  DATE OF OPERATION:  09/30/2014    PREOPERATIVE DIAGNOSES: Peripheral arterial disease with rest pain, right lower extremity.   PROCEDURES PERFORMED WHILE IN THE HOSPITAL: Include a right femoral to distal bypass graft with PTFE, a right common femoral endarterectomy, a right profunda femoris endarterectomy. For full details of that, please see the dictated operative summary.   BRIEF HISTORY: This is a gentleman with extensive peripheral vascular disease who we have treated for many years. He has had multiple previous interventions. He has had these fail and still has ischemic rest pain on the right leg despite anticoagulation. We are performing surgical bypass as well as femoral endarterectomy to provide him with adequate arterial perfusion for quality functional life and limb salvage.   HOSPITAL COURSE: The patient admitted to same day surgery and taken to the Operating Room where the above-mentioned procedures performed. For full details of that, please see the dictated operative summary. He did well and went to the critical care unit overnight. He was transitioned out to the floor on postoperative day number 1 where he increased his diet and activity over the next 3 days until he was stable for discharge on postoperative day #4. At this point his pain was controlled with oral medications. He was afebrile with vital signs stable. His incisions were clean, dry, and intact. His foot was warm with a good palpable pulse.    HOME MEDICATIONS:   1.  Allopurinol 100 mg daily.  2.  Aspirin 81 mg daily.  3.  Metformin 500 mg at bedtime.  4.  Lasix 20 mg daily.  5.  Carvedilol 6.25 mg daily.  6.  Xarelto 10 mg daily.  7.  Lisinopril 20 mg daily.  8.  Simvastatin 40 mg at bedtime.  9.  Spironolactone 25 mg daily.  10.  Percocet as needed for pain.   DISCHARGE  INSTRUCTIONS: His diet will be regular. His activity will be as tolerated. His follow up will be in 1 to 2 weeks for wound check and staple removal. He will call or contact our office if any problems or change in the interim.    ____________________________ Annice NeedyJason S. Junie Avilla, MD jsd:AT D: 10/20/2014 14:39:50 ET T: 10/21/2014 00:05:01 ET JOB#: [id number]  cc: Annice NeedyJason S. Delynda Sepulveda, MD, <Dictator> Annice NeedyJASON S Sawyer Mentzer MD ELECTRONICALLY SIGNED 10/22/2014 10:34

## 2014-12-13 NOTE — Consult Note (Signed)
PATIENT NAME:  Wayne HugueninBUSH, Khylen MR#:  [id number] DATE OF BIRTH:  1955/09/22  DATE OF CONSULTATION:  11/22/2014.  CONSULTING PHYSICIAN:  Dr.  Gilda CreaseSchnier.   REASON FOR CONSULTATION:  Coronary artery disease with previous cardiovascular disease, hypertension, hyperlipidemia, and peripheral vascular disease.   CHIEF COMPLAINT:  "I have leg pain."   HISTORY OF PRESENT ILLNESS:  This is a 59 year old male with known apparent previous coronary artery disease and old myocardial infarction who has had essential hypertension, mixed hyperlipidemia, diabetes, and severe peripheral vascular disease from previous tobacco abuse. The patient has had new-onset right leg pain at rest with significant peripheral vascular disease requiring further intervention and possible surgical intervention.  He has not had any congestive heart failure or angina with the physical activity he does and has had no recent particular cardiovascular symptoms.  The patient also has had appropriate medication management for risk factors of cardiovascular disease, including excellent blood pressure control and lipid management with diabetes improving over time.  The patient has been admitted for peripheral vascular issues and has been on anticoagulation for these reasons.    REMAINDER OF REVIEW OF SYSTEMS:  Negative for vision change, ringing in the ears, hearing loss, cough, congestion, heartburn, nausea, vomiting, diarrhea, bloody stools, stomach pain.  Positive for extremity pain, leg weakness.  Negative for syncope, dizziness, diaphoresis, skin lesions, skin rashes, depression, anxiety.   PAST MEDICAL HISTORY:  1. Peripheral vascular disease.  2. Diabetes with complications.  3. Essential hypertension.  4. Mixed hyperlipidemia.  5. Coronary atherosclerosis without angina.   FAMILY HISTORY:  Positive for pacemaker.   SOCIAL HISTORY:  Currently denies alcohol or tobacco use.   ALLERGIES :  As listed.   MEDICATIONS:  As listed.   PHYSICAL  EXAMINATION:  VITAL SIGNS:  Blood pressure is 126/68 bilaterally.  Heart rate is 72 upright, reclining, and regular.  GENERAL:  He is a well-appearing male in no acute distress.  HEAD, EYES, EARS, NOSE AND THROAT:  No icterus, thyromegaly, ulcers, hemorrhages, or xanthelasma.  CARDIOVASCULAR:  Regular rate and rhythm with normal S1 and S2 without murmur, gallop, or rub.  PMI is inferiorly displaced.  Carotid upstroke normal without bruit.  Jugular venous pressure normal.  LUNGS:  A few basilar crackles with normal respirations.  ABDOMEN:  Soft, nontender, without hepatosplenomegaly or masses.  Abdominal aorta is normal size.  EXTREMITIES:  Show 2+ radial, trace femoral, and no dorsal pedal pulses, with trace lower extremity edema.  No cyanosis, clubbing, or ulcers.  NEUROLOGIC:  He is oriented to time, place, and person, with normal mood and affect.   ASSESSMENT:  A 59 year old male with coronary artery disease stable at this time without evidence of congestive heart failure or angina with essential hypertension, mixed hyperlipidemia, and diabetes, all well controlled with medication management, with acute-onset peripheral vascular disease complications and symptoms needing further intervention at lowest risk possible for cardiovascular complication with surgery.   RECOMMENDATIONS:  1. Continue ACE inhibitor, beta blocker for further risk reduction in cardiovascular event with surgical intervention.  2. Proceed to surgery without restriction perioperatively and/or with recovery due to no current symptoms of congestive heart failure or angina.  3. Continue mixed hyperlipidemia treatment with high intensity cholesterol therapy.  4. Hypertension control with medications listed above.  5. No further cardiac diagnostic necessary at this time.    ____________________________ Lamar BlinksBruce J. Kowalski, MD bjk:kc D: 11/22/2014 07:26:43 ET T: 11/22/2014 07:58:01 ET JOB#: [id number]  cc: Lamar BlinksBruce J. Kowalski, MD,  <Dictator> Quentin CornwallBRUCE J  Gwen Pounds MD ELECTRONICALLY SIGNED 12/02/2014 8:57

## 2014-12-13 NOTE — Consult Note (Signed)
PATIENT NAME:  Wayne [name], Wayne [name] MR#:  [id number] DATE OF BIRTH:  01/17/1956  DATE OF CONSULTATION:  12/03/2014  REFERRING PHYSICIAN:  Renford DillsGregory G. Schnier, MD  CONSULTING PHYSICIAN:  Cletis Athensavid K. Yonathan Perrow, MD  PRIMARY CARE PHYSICIAN:  Marisue IvanKanhka Linthavong, MD, [facility]    REASON FOR CONSULT:  Medical management.    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 Estimated blood loss of 1200 mL with 650 mL of blood returned via Cell Saver. The patient is now being evaluated in the ICU postoperatively. Currently, he is only complaining of some pain as expected at the surgical site. Otherwise, no further complaints.   REVIEW OF SYSTEMS:   CONSTITUTIONAL: Denies fevers, chills, fatigue, weakness.  EYES: Denies blurred vision, double vision, eye pain.  EARS, NOSE, THROAT: Denies tinnitus, ear pain, hearing loss.  RESPIRATORY:  Denies cough, wheeze, shortness of breath.    CARDIOVASCULAR: Denies chest pain, palpitations, edema.  GASTROINTESTINAL: Denies nausea, vomiting, diarrhea, abdominal pain.  GENITOURINARY: Denies dysuria or hematuria.  ENDOCRINE: Denies nocturia or thyroid problems. HEMATOLOGIC AND LYMPHATIC:  Denies easy bruising or bleeding.  SKIN: Denies rash or lesion.  MUSCULOSKELETAL: Denies pain in neck, back, shoulder, knees, hips or arthritic symptoms.  NEUROLOGIC: Denies paralysis, paresthesias.  PSYCHIATRIC: Denies anxiety or depressive symptoms.  Otherwise full review of systems performed by me is negative.    PAST MEDICAL HISTORY: Includes peripheral  vascular disease as stated above, coronary artery disease without angina, hyperlipidemia, unspecified, hypertension essential, type 2 diabetes complicated by peripheral vascular disease, non-insulin-requiring as well as history of gout.   SOCIAL HISTORY: No alcohol, tobacco, drug usage.   FAMILY HISTORY: No known cardiovascular or pulmonary disorders.   ALLERGIES: No known drug allergies.   HOME MEDICATIONS: Include allopurinol 100 mg p.o. q. daily, aspirin 81 mg p.o. q. daily, Coreg 6.25 mg p.o. daily, lisinopril 20 mg p.o. q. daily, metformin extended release 500 mg p.o. at bedtime, oxycodone 20 mg p.o. q. 6 hours as needed for pain, simvastatin 40 mg p.o. at bedtime, spironolactone 25 mg p.o. daily, Xarelto 10 mg p.o. q. daily.   PHYSICAL EXAMINATION:  VITAL SIGNS: Temperature 97.6, heart rate of 87, respirations 21, blood pressure 166/100, currently 141/87, saturating 96% on room air, weight 84.8 kg, BMI 27.6.  GENERAL: Well-nourished, well-developed African-American gentleman currently in minimal distress given pain.  HEAD: Normocephalic, atraumatic.  EYES: Pupils equal, round, react to light. Extraocular muscles intact. No scleral icterus.  MOUTH: moist mucosal membrane. Dentition intact. No abscess noted.  EAR, NOSE, THROAT: Clear without exudates. No external lesions.  NECK: Supple. No thyromegaly. No nodules. No JVD.  PULMONARY: Clear to auscultation bilaterally without wheezes, rales, or rhonchi. No use of accessory muscles. Good respiratory effort.  CHEST: Nontender to palpation.  CARDIOVASCULAR: S1, S2, regular rate and rhythm. No murmurs, rubs, or gallops. No edema. Pedal pulses diminished bilaterally. GASTROINTESTINAL: Soft, nontender, nondistended. No masses. Positive bowel sounds. No hepatosplenomegaly.  MUSCULOSKELETAL: No swelling, clubbing, edema. Range of motion full in all extremities.  NEUROLOGIC: Cranial nerves II through XII intact. No gross focal neurological deficit.  Sensation intact. Reflexes intact.  SKIN: No ulceration, lesions, rash, cyanosis. Surgical sites appear intact. No erythema  or drainage noted.  PSYCHIATRIC: Mood and affect within normal limits. The patient is awake, alert, and oriented x 3. Insight and judgment intact.   LABORATORY DATA:  glucose of 127, otherwise, no other labs were performed on this admission.   ASSESSMENT AND PLAN: A 59 year old African-American gentleman with history of peripheral vascular disease, hypertension, essential, coronary artery disease, type 2 diabetes, non-insulin-requiring originally admitted on 12/02/2014 by vascular surgery, underwent an aortobifemoral bypass being consulted for routine medical management.   1.  Peripheral vascular disease. Defer care to primary service of vascular surgery, however, continue with pain medications as required. We will also add a bowel regimen at this time to help avoid postoperative constipation.  2.  Type 2 diabetes complicated by peripheral vascular disease. Hold p.o. agents, add insulin sliding scale, q. 6 hour Accu-Cheks.  3.  Hypertension, essential. Restart home medications when able to take p.o. In the meantime initiate p.r.n. hydralazine. If unable to tolerate p.o. by the morning, can have a scheduled beta blocker IV.   4.  Hyperlipidemia, unspecified, statin therapy.  5.  Venous thromboembolic prophylaxis. We will defer to vascular surgery.    CODE STATUS: The patient is full code.   TIME SPENT: 45 minutes.     ____________________________ Cletis Athens. Kristyana Notte, MD dkh:AT D: 12/03/2014 02:07:06 ET T: 12/03/2014 03:04:13 ET JOB#: [id number]  cc: Cletis Athens. Mousa Prout, MD, <Dictator> Jachai Okazaki Synetta Shadow MD ELECTRONICALLY SIGNED 12/03/2014 20:34

## 2014-12-13 NOTE — Op Note (Signed)
PATIENT NAME:  Wayne [name], Wayne [name] MR#:  [id number] DATE OF BIRTH:  29-Mar-1956  DATE OF PROCEDURE:  09/30/2014  PREOPERATIVE DIAGNOSES: 1.  Peripheral arterial disease with rest pain, right lower extremity.  2.  Status post multiple previous percutaneous interventions, right lower extremity.  3.  Diabetes.  4.  Hypertension.  5.  Hyperlipidemia.   POSTOPERATIVE DIAGNOSES: 1.  Peripheral arterial disease with rest pain, right lower extremity.  2.  Status post multiple previous percutaneous interventions, right lower extremity.  3.  Diabetes.  4.  Hypertension.  5.  Hyperlipidemia.   PROCEDURES: 1.  Right common femoral and superficial femoral endarterectomy.  2.  Right profunda femoris endarterectomy.  3.  Right femoral to anterior tibial bypass with 6 mm Distaflo graft.   SURGEONS:  Annice NeedyJason S. Dew, MD, and Renford DillsGregory G. Schnier, MD  ANESTHESIA:  General.   BLOOD LOSS:  400 mL.   INDICATION FOR PROCEDURE:  This is a gentleman well known to me for his chronic peripheral arterial disease. He has required multiple previous interventions but still has had recurrent occlusions and has ischemic rest pain. He is now brought in for surgical bypass in hopes of limb salvage. The risks and benefits were discussed. Informed consent was obtained.   DESCRIPTION OF PROCEDURE:  The patient was brought to the operative suite, and after an adequate level of general anesthesia was obtained, the right lower extremity was sterilely prepped and draped and a sterile surgical field was created. The femoral incision was created in typical fashion vertical incision I dissected out the femoral artery while Dr. Gilda CreaseSchnier was dissecting out the anterior tibial artery. He dissected this out, encircling with vessel loops proximally and distally. I got the femoral artery dissected out proximally at the distal external iliac artery, the SFA just above the previously placed stent, several centimeters beyond the origin. The profunda  femoris artery was then dissected out beyond the primary branches and into the secondary branches, which were controlled with vessel loops. We then made a small counter incision in the medial calf and tunneled a 6 mm Distaflo graft. After tunneling, we gave 5000 units of intravenous heparin. The distal anastomosis had to be created first, leaving a small hole to flush out of due to the configuration of the Distaflo graft. This was done with CV-6 sutures in the usual fashion. The anterior tibial artery was controlled with vessel loops proximally and distally and an antral arteriotomy was created with an 11 blade and extended with Potts scissors to match the Distaflo graft. Again, the anastomosis was created and a small opening was left to flush through. The graft was then pulled taut to an appropriate length and brought up to prepare an anastomosis to the distal common femoral artery and profunda femoris artery. The femoral artery was controlled and the profunda, superficial femoral and common femoral arteries with vessel loops. An arteriotomy was created in the mid-to-distal main profunda femoris artery and taken up into the common femoral artery. This was a dense, thick plaque and an endarterectomy had to be performed. An eversion endarterectomy was performed on the superficial femoral artery. The elevator was used to get a good plane in the common femoral artery, and we used also an eversion endarterectomy on the proximal and the common femoral artery with excellent pulsatile flow and no loose flecks remaining. We then turned our attention down to the profunda femoris artery. A small medial branch had an eversion endarterectomy performed, the main profunda femoris down to the secondary  bifurcation after the small medial branch. Endarterectomy was performed with the [facility]. A nice feathered distal endpoint was created with gentle traction in an eversion fashion on the main branch. The more lateral branch  was also performed with eversion endarterectomy until all large bulky plaque was removed. There was brisk backbleeding through all profunda femoris branches at this point. The graft was then cut and beveled to an appropriate length to match the arteriotomy. An anastomosis was created with a running CV-5 suture proximally. We flushed distally and then completed the anastomosis distally. A single CV-6 suture distally was used for hemostasis. The wound was irrigated. Surgicel and Evicel topical hemostatic agents were placed and hemostasis was achieved. We then closed the groin incisions with two layers of 2-0 Vicryl, two layers of 3-0 Vicryl, and staples. The medial and lateral calf incisions were closed with 3-0 Vicryl and staples. The patient was then awakened from anesthesia and taken to the recovery room in stable condition, having tolerated the procedure well.    ____________________________ Annice Needy, MD jsd:nb D: 09/30/2014 16:35:20 ET T: 10/01/2014 03:24:31 ET JOB#: [id number]  cc: Annice Needy, MD, <Dictator> Annice Needy MD ELECTRONICALLY SIGNED 10/07/2014 16:40

## 2014-12-13 NOTE — Discharge Summary (Signed)
PATIENT NAME:  Wayne [name], [name] MR#:  [id number] DATE OF BIRTH:  [birth year]  DATE OF ADMISSION:  11/19/2014 DATE OF DISCHARGE:  11/22/2014  DISCHARGE DIAGNOSIS: Ischemia of right lower extremity.   SECONDARY DIAGNOSES: Coronary artery disease, hyperlipidemia,  gout, hypertension,   PROCEDURES PERFORMED: Angiography of the abdominal aorta and right lower extremity, 11/20/2014.   CONSULTATIONS: Dr. Gwen PoundsKowalski, cardiology; Dr. Juliene PinaMody, medical management.   BRIEF HISTORY: Wayne [name] is a 59 year old gentleman who presented to the Emergency Room on April 7 with right leg pain. He had recently undergone right femoral to and distal bypass with prosthetic. He had noted a breakdown of the incision in the lateral calf as well. On physical examination, his right leg was ischemic and radiology studies confirmed occlusion of his bypass.   HOSPITAL COURSE: On the day of admission, he was started on heparin drip. He was given parenteral pain medication to control his pain. Subsequently, underwent angiography on April 8. This situation demonstrated significant inflow disease in the aorta iliac distribution and therefore no further interventions were performed. He was not a candidate for thrombolysis given his recent surgery and, therefore, plans were made for an aortobifemoral bypass. Given this, cardiology consult as well as medical consult was obtained on hospital day #3. He had been cleared medically and from cardiology point of view. He, therefore, is scheduled for surgery and is discharged to home. He is to continue his Xarelto for 1 day. He will stop Xarelto on Monday. He is to continue his other home medications.    ACTIVITIES: As tolerated.   DIET: ADA, carbohydrate-controlled.   FOLLOWUP: He will follow up on the day of surgery as a postoperative admission.    ____________________________ Renford DillsGregory G. Schnier, MD ggs:bm D: 12/08/2014 18:01:22 ET T: 12/09/2014 06:19:31 ET JOB#: [id number]  cc: Renford DillsGregory G. Schnier,  MD, <Dictator> Renford DillsGREGORY G SCHNIER MD ELECTRONICALLY SIGNED 12/11/2014 13:05

## 2014-12-13 NOTE — Op Note (Signed)
PATIENT NAME:  Wayne [name], Wayne [name] MR#:  [id number] DATE OF BIRTH:  December 11, 1955  DATE OF PROCEDURE:  12/02/2014  PREOPERATIVE DIAGNOSES:  1. Atherosclerotic occlusive disease, bilateral lower extremities, with rest pain and ulceration of the right lower extremity.  2. Distal aortic occlusion.  3. 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   POSTOPERATIVE DIAGNOSES: 1. Atherosclerotic occlusive disease, bilateral lower extremities, with rest pain and ulceration of the right lower extremity.  2. Distal aortic occlusion.  3. 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   PROCEDURES PERFORMED:  1. Aortobifemoral bypass grafting with 14 x 7 bifurcated Dacron graft.  2. Common femoral thromboendarterectomy, right side.  3. Common femoral thromboendarterectomy, left side.  4. Thromboembolectomy of the profunda femoris, right side.  5. Thromboembolectomy of the profunda femoris, left side.  6. Fogarty embolectomy of the right femoral to anterior tibial bypass graft.   CO-SURGEONS:  Dr. Gilda CreaseSchnier and Dr. Wyn Quakerew.     ANESTHESIA:  General by endotracheal intubation.   FLUIDS:  Per anesthesia record.   ESTIMATED BLOOD LOSS:  Approximately 1200 mL with 650 mL returned in Cell Saver.   SPECIMEN:  Thrombus and plaque from both the right femoral arteries as well as the left femoral arteries and thrombus from femoral distal bypass graft; all sent as separate specimens.    INDICATIONS:  Wayne [name] is a 59 year old gentleman who has been having increased pain as well as an ulceration on the right lateral calf.  Workup has demonstrated distal aortic occlusion, and the patient is therefore undergoing bypass surgery with endarterectomies to allow for optimal outflow.  This is being performed in an attempt to salvage his right leg.  The risks and benefits as well as the threat of limb loss have been discussed in detail with the patient, and all questions have been answered.  The patient agrees to proceed.    DESCRIPTION OF PROCEDURE:  The patient is taken to the operating room, placed in the supine position.  After adequate general anesthesia is induced, appropriate invasive monitors are placed.  A central line is placed by Dr. Wyn Quakerew and will be dictated as a separate procedure by him.  He is then prepped and draped in a sterile fashion from the nipple line down to the knees. Appropriate timeout is called.   Working initially on the left groin, a vertical incision is created and the dissection is carried down through the soft tissues to expose the femoral sheath which is then opened longitudinally. The dissection is rather difficult and meticulous secondary to fairly dense fibrotic scarring from the multiple StarClose devices deployed in the past.  Ultimately both the common femoral at origin of the SFA and the profunda femoris down to the quaternary branches is dissected and  Silastic vessel loops were placed around each of these individual branches.  Saline-moistened  sponge is placed in the wound.   Attention is then turned to the right groin which is re-incised through the previous incisional scar.  The dissection is then carried down to expose the bypass which is then used to guide the dissection back to the common femoral.  The common femoral is noted to be occluded as is the left, and subsequently the dissection is carried out to expose the profunda femoris again at the fourth-order branches looping these individually.  SFA origin as well as the graft origin and the common femoral are also looped.  A saline-moistened sponge is then placed in the wound.   A midline incision is then  created and the peritoneal cavity is entered without difficulty.   Viscera are inspected and then swept into the right gutter and packed away with a saline-moistened surgical towel.  Omni-Tract retractor system is then placed in the wound to allow  exposure of the retroperitoneum.   Retroperitoneum is then opened vertically  and the dissection is carried down to expose the renal vein.  The aorta is then identified and dissected circumferentially at this level.  It appears soft and strongly pulsatile.  Dissection is then carried along the anterior margin of the aorta distally exposing the IMA which is dissected circumferentially and looped with a Silastic vessel loop. Below the level of the IMA, the aorta becomes much more firm with a bluer discoloration consistent with thrombosis.   Tunnels are then created from the aortic bifurcation under the retroperitoneal tissues and out the groins, and umbilical tapes are placed, both right and left.   With Dr. Wyn Quaker on the left and myself on the right, 5000 units of heparin is given.  A 14 x 7 bifurcated aortic graft is selected, and after 5 minutes of heparinization, the aorta is clamped.  Aortotomy is made, extended with Potts scissors.  A 5-0 Prolene stay suture is then placed to allow for exposure.  The graft is beveled, and an end graft to side aorta anastomosis is fashioned with running 3-0 Prolene with Dr. Wyn Quaker sewing on the left side of the anastomosis and myself on the right.  Flushing maneuvers are performed, and the anastomosis is noted to be hemostatic.  The graft itself is then clamped just above the suture line and irrigated with heparinized saline. The left limb is then pulled through the tunnel and it is verified to be straight.  The distal common femoral and profunda femoris are then opened using an 11 blade to make the arteriotomy and then extending it with the Potts scissors.  The arteriotomy is extended down to the fourth-order branches.  The graft is then beveled, and an end graft to side distal common femoral and profunda femoris anastomosis is fashioned using running 5-0 Prolene.  Flushing maneuvers are performed.  One small bleeding area is controlled with a single 5-0 Prolene. Otherwise, the anastomosis is hemostatic.  Flow is then re-established to the left lower  extremity through the profunda branches.  Endarterectomy is also performed of the SFA, although by palpation it likely occludes more distally.   The right limb of the graft is then pulled through the retroperitoneal tunnel.  The graft is inspected for its straightness and tension.  Arteriotomy is made in the common femoral and then extended down again to approximately the fourth-order branches.  The graft is beveled, and an end graft to side common femoral and profunda femoris anastomosis is fashioned.  Prior to doing this, a #3 Fogarty is passed down the femoral distal Gore-Tex graft.  A large amount of thrombus is retrieved.  Minimal backbleeding is noted.  It is then irrigated with heparinized saline.  As noted, once this has been accomplished, an end graft to side common femoral to profunda femoris anastomosis is fashioned with running 6-0 Prolene.  The anastomosis is inspected for hemostasis.  Left side is rechecked as is the proximal, and then the Surgicel and Evicel are placed in the retroperitoneal area and the retroperitoneum is reapproximated with running 0 Vicryl.  The viscera are returned to their anatomic location.  They are inspected and found to be pink and appear to be peristalsing normally.  Omentum  is then placed in front, and the fascia is reapproximated with running looped #1 PDS with interspersed [place] knots.   Again working with Dr. Wyn Quaker on the left and myself on the right, the groin incisions are reconstructed in multiple layers using 0 Vicryl, 2-0 Vicryl, and 3-0 Vicryl.  Five layers are used on the left, four layers used on the right.  All skin incisions are then closed with surgical staples and sterile dressings are applied.   The patient tolerated the procedure well.  There were no episodes of hemodynamic instability, and he was taken to the recovery area in stable condition.    ____________________________ Renford Dills, MD ggs:kc D: 12/02/2014 20:48:12  ET T: 12/02/2014 21:35:58 ET JOB#: [id number]  cc: Renford Dills, MD, <Dictator> Renford Dills MD ELECTRONICALLY SIGNED 12/08/2014 12:49

## 2014-12-13 NOTE — Op Note (Signed)
PATIENT NAME:  Wayne [name], Wayne [name] MR#:  [id number] DATE OF BIRTH:  03-27-1956  DATE OF PROCEDURE:  12/02/2014  PREOPERATIVE DIAGNOSES:  1.  Peripheral arterial disease with ulceration of the right calf and rest pain right lower extremity. 2.  Peripheral arterial disease with claudication, left lower extremity.  3.  Thrombosed right femoral to distal bypass.  4.  Hyperlipidemia.  5.  Hypertension.   PROCEDURES:   1.  Aortobifemoral bypass with a 14 x 7 Dacron graft.  2.  Re-exploration right groin.  3.  Right common femoral artery and profunda femoris artery thromboendarterectomies.  4.  Fogarty embolectomy right femoral to distal bypass graft.   5.  Left common femoral artery, profunda femoris artery, and superficial femoral artery endarterectomy  CO-SURGEONS:  Festus Barren, MD and Levora Dredge, MD.    ANESTHESIA: General.   ESTIMATED BLOOD LOSS:  1200 mL, approximately half of which was replaced with Cell Saver.   INDICATION FOR PROCEDURE: This is a 59 year old gentleman with extensive peripheral vascular disease who has undergone multiple previous revascularizations percutaneously to the right lower extremity and a femoral to distal bypass. All have failed. 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 He has critical limb threatening symptoms on the right with a nonhealing wound in his right calf from previous surgery as well as ischemic rest pain. We are attempting limb salvage given his reasonably young age and functional status. We are planning aortobifemoral bypass and other revascularization procedures as necessary to try to improve his circulation. We are also planning to try to perform a Fogarty embolectomy on his previous bypass graft in the right lower extremity.    DESCRIPTION OF PROCEDURE: The patient is brought to the operative suite and after an adequate level of general anesthesia was obtained his abdomen and groins were sterilely prepped and draped and a sterile surgical field was created. We started by exposing the groins and dissecting out the femoral arteries. The right side was quite difficult as it was only about 3 months status post right femoral to distal bypass and there was dense fibrotic tissue. This was a re-exploration of the right groin. We were able to dissect out the profunda femoris artery and its primary branches distal to the bypass, the SFA, the bypass graft itself, and the common femoral artery on the right and encircle all these with vessel loops. We then dissected out the left femoral artery. This was a little more straight forward as this was not a reoperative groin, but the StarClose devices in the proximal common femoral artery and distal external iliac artery did create a fibrotic reaction. We dissected out 4 main profunda branches on the left and the SFA as well as the proximal common femoral artery and encircled all of these with vessel loops. We then turned our attention to the abdominal portion. A large midline laparotomy incision was created. We dissected through the fascia with electrocautery. We exposed the retroperitoneum and retracted the bowels to the right, an Omni-Tract retractor was used to help facilitate our exposure. We Kocherized the duodenum and opened the retroperitoneum to the left of the duodenum, exposing the aorta which had a good pulse. There was some mural thrombus noted on the CT scan, this could be identified. A large IMA was identified and controlled  with vessel loops. We prepared the mid infrarenal aorta for clamping. I dissected down to the aortic bifurcation on each side into the common iliac arteries. We then created our tunnel just below the inguinal ligament on each side and staying just atop the iliac  arteries. We brought umbilical tapes up each side and then gave the patient 5000 units of intravenous heparin. At this point this was allowed to circulate for over 5 minutes. We selected a 14 x 7 graft. A straight aortic clamp was used to clamp the aorta proximally.  A Harken C clamp was used for distal control and control was pulled up on the IMA.  An anterior wall arteriotomy was created on the aorta and the graft was cut and beveled to an appropriate length to match the aortotomy with the short main body before the bifurcation of the graft. 3-0 Prolene suture was used to create anastomosis in the usual fashion. The vessel was flushed through the graft with good pulsatile inflow and the clamps were removed. We then turned our attention first to the left femoral arteries. We passed a large Kelly using umbilical tape as a guide, keeping this just above the iliac artery. The graft was cut to the proper orientation.  We then pulled up for control of the vessel loops on the femoral vessels. An anterior wall arteriotomy was created with an 11 blade and extended with Potts scissors in the mid to distal common femoral artery and down about 2-3 cm onto the profunda femoris artery. A bulky plaque was identified. The Therapist, nutritionalreer elevator was used to help get a good dissection plane. We were able to get several centimeters proximal to our suture line in the common femoral artery, and a nice eversion endarterectomy was performed. An eversion endarterectomy was also performed on the superficial femoral artery about 3-4 cm down the artery by placing a hemostat down the SFA and pulling out a large plug of plaque with back bleeding identified. The profunda femoris artery endarterectomy was more detailed. We dissected out until the plaque ended at the primary bifurcations. A posterior branch of the profunda femoris artery was also treated with a short segment eversion endarterectomy. A nice feathered distal endpoint was created on the  main profunda branches with the Therapist, nutritionalreer elevator. The graft was then cut and beveled to an appropriate length to match the arteriotomy, and then anastomosis was created with a running 5-0 Prolene suture in the usual fashion. A single 5-0 Prolene patch suture with a pledget was used for hemostasis and the vessel was flushed and de-aired prior to releasing control. On release there was excellent pulsatile flow into the profunda femoris artery branches. There was also good back flow of the common femoral artery to the external iliac artery that was palpable. We then turned our attention to the right side, which was the more difficult side due to the surgical scar. Control was pulled up on the vessel loops. An arteriotomy was created in the main profunda femoris artery. The lateral branch and then the primary branches bifurcation distally were controlled. A large amount of thrombus was identified on opening the profunda up through into the common femoral artery at the area encompassing the graft portion of the previous femoral to distal bypass graft origin to be able to gain access to that as well.  We took a 3 Fogarty embolectomy balloon and removed a large amount of thrombus from the femoral to distal bypass graft, passing this about 40-50 cm and distally.  There was not good backbleeding despite passing the Fogarty several times and getting no further thrombus on the final pass. A thromboendarterectomy was performed of the common and profunda femoris artery with extensive thrombus removed, there was thrombus out the main profunda branches that was pulled out with gentle traction, with brisk backbleeding. We also pulled out thrombus up into the common femoral artery that was present. There was also a plane of plaque that was peeled out as part of this with a Therapist, nutritional in both the common femoral artery and the profunda femoris artery. At this point the vessel was locally heparinized and anastomosis was created with a  running 5-0 Prolene suture after the graft was cut and beveled to an appropriate length to match the arteriotomy.  The vessel was flushed and de-aired prior to releasing control with excellent pulsatile inflow. A good palpable pulse was felt in the profunda femoris artery distal to the bypass. At this point we felt we had revascularized him as much as possible. We had performed a Fogarty on the bypass graft, although without brisk backbleeding it is not clear if we were able to clear this distally. I felt there was very little more we could do at this time to try to salvage this. At this point Surgicel and Evicel topical hemostatic agents were placed and hemostasis was complete. The retroperitoneum was closed with a running 0 Vicryl. The fascia was closed with 2 looped number 1 PDS sutures. The subcutaneous tissue was closed with 3-0 Vicryl and skin was coapted with staples. The femoral incisions were closed with 2 layers of 2-0 Vicryl, 2 layers of 3-0 Vicryl, and staples. The patient was then awakened from anesthesia and taken to the recovery room in stable condition, having tolerated the procedure well.    ____________________________ Annice Needy, MD jsd:bu D: 12/02/2014 19:55:15 ET T: 12/02/2014 20:11:46 ET JOB#: [id number]  cc: Annice Needy, MD, <Dictator> Annice Needy MD ELECTRONICALLY SIGNED 12/03/2014 12:53

## 2014-12-13 NOTE — Op Note (Signed)
PATIENT NAME:  Wayne [name], Wayne [name] MR#:  [id number] DATE OF BIRTH:  March 17, 1956  DATE OF PROCEDURE:  09/07/2014  PREOPERATIVE DIAGNOSES:  1.  Peripheral arterial disease with rest pain right lower extremity, status post multiple previous interventions.  2.  Diabetes.  3.  Hypertension.  4.  Known left common femoral artery occlusion.   POSTOPERATIVE DIAGNOSES:  1.  Peripheral arterial disease with rest pain right lower extremity, status post multiple previous interventions.  2.  Diabetes.  3.  Hypertension.  4.  Known left common femoral artery occlusion.   PROCEDURES:   1.  Ultrasound guidance for vascular access to left common femoral artery.  2.  Catheter placement into right posterior tibial artery.  3.  Catheter placement to right profunda femoris artery.  4.  Aortogram and selective right lower extremity angiogram.  5.  Percutaneous transluminal angioplasty of posterior tibial artery and tibioperoneal trunk with 3 mm diameter angioplasty balloon.  6.  Percutaneous transluminal angioplasty of entire right superficial femoral artery and popliteal artery with 5 mm diameter angioplasty balloon.  7.  Percutaneous transluminal angioplasty of proximal right superficial femoral artery and common femoral artery with 6 mm diameter Lutonix drug-coated angioplasty balloon.  8.  Percutaneous transluminal angioplasty of right profunda femoris artery with 5 mm diameter Lutonix drug-coated angioplasty balloon.  9.  StarClose closure device, left femoral artery.   SURGEON: Annice Needy, MD.   ANESTHESIA: Local with moderate conscious sedation.   ESTIMATED BLOOD LOSS: Minimal.   FLUOROSCOPY TIME: 15 minutes and 85 mL of contrast were used.    INDICATION FOR PROCEDURE: This is a 59 year old gentleman who has severe peripheral vascular disease, I have done multiple previous interventions on the right lower extremity, but over the past month he has developed ischemic rest pain of the right foot. He is  brought back for angiogram for further evaluation and potential treatment. Risks and benefits were discussed. Informed consent was obtained.   DESCRIPTION OF PROCEDURE: The patient was brought to the vascular suite. Groins were shaved and prepped and a sterile surgical field was created. Due to the pulseless nature of the left groin with common femoral occlusion we used ultrasound to access the top of the left common femoral artery occlusion with a micropuncture needle.  This was done under direct ultrasound guidance, a permanent image was recorded. Micropuncture wire and sheath were placed. We then upsized to a 5 [place] sheath over a stiff wire. A pigtail catheter was placed in the aorta at the L1 level and AP aortogram was performed. This showed normal renal arteries, normal aorta, and iliac segments without flow-limiting stenosis. I then crossed the aortic bifurcation and advanced to the right femoral head and selective right lower extremity angiogram was then performed. This showed a near occlusive string sign stenosis in the profunda femoris artery, the superficial femoral artery had a short stump of about 1-2 cm that occluded at the level just above the previously placed stent. He had multiple previous SFA and popliteal stents. He reconstituted distally in the popliteal artery below the knee. He had what appeared to be 2 vessels distally with a smaller posterior tibial artery and a larger anterior tibial artery. I heparinized the patient, put a 6 [place] Ansell sheath over a [employer] wire. I was able to gain access to the SFA occlusion initially without difficulty, cross that occlusion, and get into the popliteal artery. From there after injection of contrast I was able to cross the tibioperoneal trunk and proximal posterior  tibial artery occlusion and get access to the mid posterior tibial artery.  An 0.018 wire was then placed. Angioplasty was performed in the posterior tibial artery and  tibioperoneal trunk with a 3 mm diameter angioplasty balloon. I then turned my attention to the long SFA and popliteal occlusions and treated the entirety of the popliteal artery and the superficial femoral artery with 5 mm diameter angioplasty balloon. Following this there was still essentially no flow distally.  I upsized to a 6 mm diameter Lutonix drug-coated angioplasty balloon in the proximal superficial femoral artery and common femoral artery, however there remained essentially no flow distally. I put a catheter back into the popliteal artery. 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 This had been going on for over a month and I did not feel like catheter-directed thrombolysis or thrombectomy were going to be of much benefit. on this more chronic thrombosis. At this point the anterior tibial remained in the best runoff distally with the posterior tibial artery now being smaller after intervention to give him better collateralization to allow us to consider surgical bypass. I then turned my attention to the near occlusive profunda femoris artery stenosis, I was able to navigate through this with a mild amount of difficulty and confirm intraluminal flow in the profunda femoris artery after its primary branches. I then treated this with a 5 mm diameter x 8 cm length Lutonix drug-coated angioplasty balloon with a good angiographic completion result and only about a 20%-30% residual stenosis with more brisk flow through collaterals at this point. At this point I felt I had helped him as much we could percutaneously and we would consider for surgical bypass. The sheath was removed. StarClose closure device was deployed in the usual fashion with excellent hemostatic result. The patient tolerated the procedure well and was taken to the recovery room in stable condition.     ____________________________ Annice NeedyJason S. Selenia Mihok, MD jsd:bu D: 09/07/2014 11:49:12 ET T: 09/07/2014 14:35:26 ET JOB#: [id number]  cc: Annice NeedyJason S. Tykel Badie, MD, <Dictator> Marisue IvanKanhka Linthavong, MD Annice NeedyJASON S Daishon Chui MD ELECTRONICALLY SIGNED 09/16/2014 11:53

## 2014-12-13 NOTE — Op Note (Signed)
PATIENT NAME:  Wayne [name], [name]:  [id number] DATE OF BIRTH:  1955-12-29  DATE OF PROCEDURE:  09/30/2014  PREOPERATIVE DIAGNOSES:  1. Atherosclerotic occlusive disease, bilateral lower extremities, with rest pain of the right lower extremity.  2. Complication dialysis device with occlusion of previously placed stents.  3. History of tobacco abuse.   POSTOPERATIVE DIAGNOSES:   1. Atherosclerotic occlusive disease, bilateral lower extremities, with rest pain of the right lower extremity.  2. Complication dialysis device with occlusion of previously placed stents.  3. History of tobacco abuse.   PROCEDURES PERFORMED: 2. Right femoral to anterior tibial bypass grafting using a 6 mm distal flow graft.  3. Endarterectomy, right profunda femoris.  4. Endarterectomy, right common femoral.   PROCEDURES PERFORMED BY: Dr. Wyn Quakerew, Dr. Gilda CreaseSchnier, co- surgeons.   ANESTHESIA: General by endotracheal intubation.   SPECIMEN: Plaque from the common femoral and the profunda femoris to pathology for permanent section.   INDICATIONS: [name] is a 59 year old gentleman who presented to the office with rest pain symptoms. He is still working and finding this to be a major problem with his life and occupation. The risks and benefits for surgical bypass, as he has failed multiple interventions, were reviewed. All questions answered. The patient has agreed to proceed.   DESCRIPTION OF PROCEDURE: The patient is taken to the operating room and placed in the supine position. After adequate general anesthesia is induced and appropriate invasive monitors are placed, he is positioned supine, and his right leg and groin area are prepped and draped in a sterile fashion. Appropriate timeout is called.   Working simultaneously, a linear incision is created over the lateral shin,  as well as vertically in the groin, and the dissections are carried down through the soft tissues and muscles to expose the arteries. The anterior tibial  artery is looped proximally and distally with Silastic vessel loops and small clips are placed on several branches. The profunda femoris, common femoral, and superficial femoral are exposed as well, and the profunda femoris is dissected down to its tertiary branches, each are looped individually.   Medial calf incision is then created and a tunnel then made using the Gore tunneling device. A 6 mm distal flow graft is then pulled from the anterior tibial incision, through the interosseous membrane, and then posterior to the knee up subsartorially to the femoral. Heparin 5000 units is given and allowed to circulate.   The graft is then adjusted for length. The anterior tibial artery is occluded proximally and distally with Silastic vessel loops. Arteriotomy is made, extended with Potts scissors, and an end distal flow graft to side anterior tibial artery anastomosis was fashioned with CV-6 suture. A gap is left in the suture line to allow for flushing. Attention is then turned to the femoral.   The arteriotomy is then made, beginning in the profunda and extending it up along the profunda and into the common femoral, up to the level of the ilioinguinal ligament. Endarterectomy of the common femoral and origin of the SFA is then performed under direct visualization. A flat edge is obtained at the level of the ilioinguinal ligament, actually slightly above that, such that the endarterectomy in the common is essentially brought up to the distal external. A small area posteriorly is repaired with 2 interrupted 6-0 Prolene sutures. An endarterectomy is then performed of the profunda femoris, extending it down into the tertiary branches and performing eversion endarterectomies of each individual branch.   The graft is then beveled  with a fairly long spatulated area and applied as an end graft to side common femoral and profunda femoris using running CV-5 suture. Several bleeding areas are controlled with pledgeted  CV-6 suture. Flushing maneuvers were performed and flow was established, first to the profunda and then distally. Once the graft has been adequately flushed, the suture line, distally, it is completed as well, and then flow is established, retrograde and then antegrade. Soft pulse is noted in the dorsalis pedis distally.   All 3 wounds were then irrigated with sterile saline. Evicel is placed. Surgicel is placed along the suture lines, and subsequently all wounds were closed. Leg wounds were closed with 3-0 Vicryl and staples. The groin wound is closed and in 5 layers, using 2 layers of 2-0, followed by 3 layers of 3-0, and then surgical staples. Sterile dressings are applied. The patient tolerated the procedure well. There were no immediate complications, and he was taken to the recovery area in stable condition.    ____________________________ Renford Dills, MD ggs:mw D: 09/30/2014 11:32:19 ET T: 09/30/2014 17:12:17 ET JOB#: [id number]  cc: Renford Dills, MD, <Dictator> Renford Dills MD ELECTRONICALLY SIGNED 10/20/2014 14:25

## 2014-12-13 NOTE — H&P (Signed)
Subjective/Chief Complaint painful right leg   History of Present Illness The patient is a 59 year old male who presented to the ER with increasing right leg pain.  He notes the pain began 2 weeks ago but over the past several days increased tremendously.  Tennis Must denies trauma.  He is s/p extensive vascular reconstruction 6 weeks ago at which time the femoral artery was cleared fo plaque and a femoral to distal bypass with PTFE was placed.  He was started on Xarelto at that time.  CT angio shows occlusion of the surgery.   Past History see below   Past Med/Surgical Hx:  pvd:   myocrdial infarction:   hyperlipidemia:   gout:   hypertension:   ALLERGIES:  No Known Allergies:   HOME MEDICATIONS: Medication Instructions Status  allopurinol 100 mg oral tablet 1 tab(s) orally once a day  Active  aspirin 81 mg oral tablet 1 tab(s) orally once a day Active  metFORMIN extended release 500 mg oral tablet, extended release 1 tab(s) orally once a day (at bedtime) Active  carvedilol 6.25 mg oral tablet 1 tab(s) orally once a day (in the morning) Active  Xarelto 10 mg oral tablet 1 tab(s) orally once a day Active  lisinopril 20 mg oral tablet 1 tab(s) orally once a day (in the morning) Active  simvastatin 40 mg oral tablet 1 tab(s) orally once a day (at bedtime) Active  spironolactone 25 mg oral tablet 1 tab(s) orally once a day Active  acetaminophen-oxyCODONE 325 mg-5 mg oral tablet 1--2 tab(s) orally every 6 hours, As Needed - for Pain Active   Family and Social History:  Family History no hypercoagulable, no porphyria or autoimmune disease   Social History positive tobacco (Greater than 1 year), negative ETOH, negative Illicit drugs   + Tobacco Prior (greater than 1 year)   Place of Living Home   Review of Systems:  Subjective/Chief Complaint right leg pain   ROS No TIA/stroke/seizure No heat or cold intolerance No dysuria/hematuria No blurry or double vision No tinnitus or ear  pain No rashes or ulcer No suicidal ideation or psychosis No signs of bleeding or easy bruising No SOB/DOE, orthopnea, or sputum No palpitations or chest pain No N/V/D or abdominal pain No joint pain or joint swelling No fever or chills No unintentional weight loss or gain   Physical Exam:  GEN well developed, no acute distress   HEENT moist oral mucosa, Oropharynx clear   NECK supple  No masses  trachea midline   RESP normal resp effort  no use of accessory muscles   CARD regular rate  No LE edema  no JVD   ABD soft  normal BS  nondistended   EXTR right leg cold with slow cap refill pedal and femoral pulses nonpalpable; left leg 1+ femoral pulse DP and PT not palpable foot warm with good cap refill   SKIN No rashes, positive ulcers, lateral calf incision is still not healed   NEURO cranial nerves intact, follows commands, motor/sensory function intact   PSYCH alert, A+O to time, place, person, good insight   Lab Results: LabObservation:  07-Apr-16 14:56   OBSERVATION Reason for Test Pain  Routine Chem:  07-Apr-16 15:42   Result Comment LABS - This specimen was collected through an   - indwelling catheter or arterial line.  - A minimum of 89ms of blood was wasted prior    - to collecting the sample.  Interpret  - results with caution.  Result(s) reported on 19 Nov 2014 at 04:16PM.  Glucose, Serum 96 (65-99 NOTE: New Reference Range  10/20/14)  BUN 14 (6-20 NOTE: New Reference Range  10/20/14)  Creatinine (comp) 1.13 (0.61-1.24 NOTE: New Reference Range  10/20/14)  Sodium, Serum 138 (135-145 NOTE: New Reference Range  10/20/14)  Potassium, Serum 4.0 (3.5-5.1 NOTE: New Reference Range  10/20/14)  Chloride, Serum 102 (101-111 NOTE: New Reference Range  10/20/14)  CO2, Serum 27 (22-32 NOTE: New Reference Range  10/20/14)  Calcium (Total), Serum 9.8 (8.9-10.3 NOTE: New Reference Range  10/20/14)  Anion Gap 9  eGFR (African American) >60  eGFR  (Non-African American) >60 (eGFR values <16m/min/1.73 m2 may be an indication of chronic kidney disease (CKD). Calculated eGFR is useful in patients with stable renal function. The eGFR calculation will not be reliable in acutely ill patients when serum creatinine is changing rapidly. It is not useful in patients on dialysis. The eGFR calculation may not be applicable to patients at the low and high extremes of body sizes, pregnant women, and vegetarians.)  Routine Coag:  07-Apr-16 15:42   Activated PTT (APTT)  46.3 (A HCT value >55% may artifactually increase the APTT. In one study, the increase was an average of 19%. Reference: "Effect on Routine and Special Coagulation Testing Values of Citrate Anticoagulant Adjustment in Patients with High HCT Values." American Journal of Clinical Pathology 21245;809:983-382)  Prothrombin  17.7 (11.4-15.0 NOTE: New Reference Range  09/11/14)  INR 1.4 (INR reference interval applies to patients on anticoagulant therapy. A single INR therapeutic range for coumarins is not optimal for all indications; however, the suggested range for most indications is 2.0 - 3.0. Exceptions to the INR Reference Range may include: Prosthetic heart valves, acute myocardial infarction, prevention of myocardial infarction, and combinations of aspirin and anticoagulant. The need for a higher or lower target INR must be assessed individually. Reference: The Pharmacology and Management of the Vitamin K  antagonists: the seventh ACCP Conference on Antithrombotic and Thrombolytic Therapy. CNKNLZ.7673Sept:126 (3suppl): [id number] A HCT value >55% may artifactually increase the PT.  In one study,  the increase was an average of 25%. Reference:  "Effect on Routine and Special Coagulation Testing Values of Citrate Anticoagulant Adjustment in Patients with High HCT Values." American Journal of Clinical Pathology 2006;126:400-405.)  Routine Hem:  07-Apr-16 15:42   WBC (CBC)  9.9  RBC (CBC) 5.24  Hemoglobin (CBC) 14.9  Hematocrit (CBC) 46.7  Platelet Count (CBC) 339 (Result(s) reported on 19 Nov 2014 at 04:15PM.)  MCV 89  MCH 28.5  MCHC  31.9  RDW 14.3   Radiology Results: [place]    07-Apr-16 14:56, [place] Flow Doppler Lower Extrem Right (Leg)  [place] Flow Doppler Lower Extrem Right (Leg)  REASON FOR EXAM:    Pain  COMMENTS:       PROCEDURE: [place] - [place]orea[place]OPPLER LOW EXTR RIGHT  - Nov 19 2014  2:56PM     CLINICAL DATA:  Right lower extremity pain for 2 weeks. History of  surgery (09/30/2014). Evaluate for DVT.    EXAM:  RIGHT LOWER EXTREMITY VENOUS DOPPLER ULTRASOUND    TECHNIQUE:  Gray-scale sonography with graded compression, as well as color  Doppler and duplex ultrasound were performed to evaluate the lower  extremity deep venous systems from the level of the common femoral  vein and including the common femoral, femoral, profunda femoral,  popliteal and calf veins including the posterior tibial, peroneal  and gastrocnemius veins when visible. The  superficial great  saphenous vein was also interrogated. Spectral Doppler was utilized  to evaluate flow at rest and with distal augmentation maneuvers in  the common femoral, femoral and popliteal veins.    COMPARISON:  None.    FINDINGS:  Contralateral Common Femoral Vein: Respiratory phasicity is normal  and symmetric with the symptomatic side. No evidence of thrombus.  Normal compressibility.    Common Femoral Vein: No evidence of thrombus. Normal  compressibility, respiratory phasicity and response to augmentation.    Saphenofemoral Junction: No evidence of thrombus. Normal  compressibility and flow on color Doppler imaging.    Profunda Femoral Vein: No evidence of thrombus. Normal  compressibility and flow on color Doppler imaging.    Femoral Vein: No evidence of thrombus. Normal compressibility,  respiratory phasicity and response to augmentation.    Popliteal Vein: No evidence of  thrombus. Normal compressibility,  respiratory phasicity and response to augmentation.    Calf Veins: No evidence of thrombus. Normal compressibility and flow  on color Doppler imaging.    Superficial Great Saphenous Vein: No evidence of thrombus. Normal  compressibility and flow on color Doppler imaging.    Venous Reflux:  None.    Other Findings:  None.     IMPRESSION:  No evidence of DVT within the right lower extremity.      Electronically Signed    By: Sandi Mariscal M.D.    On: 11/19/2014 15:18         Verified By: Aileen Fass, M.D.,  [place]:  PACS Image    07-Apr-16 17:01, CT Angiography Aorta Iliofemoral Runoff  PACS Image  CT:  CT Angiography Aorta Iliofemoral Runoff  REASON FOR EXAM:    increasing RLE pain, decreased pulses, decreased cap   refill, normal venous U/S,  COMMENTS:       PROCEDURE: CT  - CT ANGIOGRAPHY AORTA RUNOFF  - Nov 19 2014  5:01PM     CLINICAL DATA:  Increased right lower extremity pain with decreased  pulses and distal capillary refill.    EXAM:  CT ANGIOGRAPHY OF ABDOMINAL AORTA WITH ILIOFEMORAL RUNOFF    TECHNIQUE:  Multidetector CT imaging of the abdomen, pelvis and lower  extremities was performed using the standard protocol during bolus  administration of intravenous contrast. Multiplanar CT image  reconstructions and MIPs were obtained to evaluate the vascular  anatomy.    CONTRAST:  150 cc Omnipaque 350 intravenous    COMPARISON:  None.    FINDINGS:  BODY WALL: No contributory findings.    LOWER CHEST: Remote transmural infarct of the free wall left  ventricle with marked thinning. No evidence of thrombus.    Small sliding hiatal hernia.  ABDOMEN/PELVIS:    Liver: No focal abnormality.    Biliary: No evidence of biliary obstruction or stone.    Pancreas: Unremarkable.    Spleen: Unremarkable.    Adrenals: Unremarkable.    Kidneys and ureters: No hydronephrosis or stone. Mild left renal  cortical  scarring.  Bladder: There is eccentric thickening of the left bladder wall with  a contiguous ovoid nodule along the left pelvic sidewall measuring  24 x 15 mm in axial dimension. There is no definitive internal fluid  density to suggest decompressed diverticulum and further workup is  recommended.    Reproductive: No pathologic findings.    Bowel: Extensive colonic diverticulosis.  No active inflammation.    Peritoneum: No ascites or pneumoperitoneum.    OSSEOUS: No acute abnormalities.    Aorta:  Diffuse atheromatous wall thickening with predominately  noncalcified and irregular plaque. No aneurysm or dissection.    Right Lower Extremity: Diffuse calcified and noncalcified plaque  throughout the right common and external iliac arteries with diffuse  mild stenosis. There is occlusion of the right hypogastric artery,  reconstituted by gluteal branches. Just beyond the deep circumflex  iliac artery, there is occlusion of the right common femoral artery.  There is no enhancement throughout the stented superficial femoral  artery, stented popliteal artery, or femoralto anterior tibial  graft. Delayed imaging was performed through the popliteal and  inferior branches, and no delayed arterial enhancement is present in  these vessels. The profunda femoral artery is occluded at its  origin, but there is reconstitution via muscular branches, which  weakly ramify the anterior tibial artery just below the graft. This  anterior tibial artery is the single vessel runoff at the ankle.  Minimal intermittent flow is present in the posterior tibial artery,  but not present at the ankle.    Left Lower Extremity: There is diffuse predominantly noncalcified  plaque on the common and external iliac artery with up to moderate  stenosis. There is chronic appearing occlusion of the left common  femoral and superficial femoral arteries with reconstitution of the  profunda femoris by the obturator artery  predominantly. The  superficial femoral artery occlusion continues to just above the  adductor hiatus where there is reconstitution via muscular branches  from the deep system. There is atherosclerosis of the infrapopliteal  arteries but prompt three-vessel runoff at the ankle    These results were called by telephone at the time of interpretation  on 11/19/2014 at 5:29 pm to Dr. Hinda Kehr , who verbally  acknowledged these results.    Review of the MIP images confirms the above findings.     IMPRESSION:  1. Recent right common femoral artery occlusion with no flow in the  femoral-anterior tibial graft and stented superficial femoral and  popliteal arteries. The upper profunda femoris is occluded but  reconstitutes and provides infrapopliteal flow via the attenuated  anterior tibial artery.  2. Chronic appearing left common and superficial femoral artery  occlusion with reconstitution via the obturator arteryand profunda  femoris.  3. 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  4. Left-sided bladder wall thickening with contiguous mass.  Recommend urography protocol abdominal CT when clinically feasible.      Electronically Signed    By: Monte Fantasia M.D.    On: 11/19/2014 17:33         Verified By: Gilford Silvius, M.D.,    Assessment/Admission Diagnosis 1.  Ischemic right lower extremity  This is a very complex problem he has had numerous interventions in the past whish ultimately ended up with extensive surgical reconstruction.  This was just 6 weeks ago and is now thrombosed.  He is in [place] and his foot is clearly ischemic and requires treatment.  I have discussed the options for limb salvage with him and discussed the high likelyhood of amputation.  given the recent surgery he is not a candidated for proloned TPA infusion as I believe his risk of bleeding is too high.  However, he would likely tollerate a short cours of TPA with mechanical  aspiration and then intervention.  I do not believe open surgery has any role given the poor durability of his recent surgery.  His Xarelto will be held and he is started on a Heparin GTT.  I  will plan for angiogram with intervention in the am.  Again I have been very up front about the poor prognosis for limb salvage 2.  ASO of the lower extremities  see above 3.  Diabetes  sliding scale for now hold metformin 4.  Hypertension  continue home meds 5.  Coronary artery disease  continue home meds, medicine consult requested significantly raises the risks for limb salvage   Plan level 3 admit   Electronic Signatures: Hortencia Pilar (MD)  (Signed 07-Apr-16 19:25)  Authored: CHIEF COMPLAINT and HISTORY, PAST MEDICAL/SURGIAL HISTORY, ALLERGIES, HOME MEDICATIONS, FAMILY AND SOCIAL HISTORY, REVIEW OF SYSTEMS, PHYSICAL EXAM, LABS, Radiology, ASSESSMENT AND PLAN   Last Updated: 07-Apr-16 19:25 by Hortencia Pilar (MD)

## 2014-12-14 NOTE — Discharge Summary (Addendum)
PATIENT NAME:  Wayne [name], Wayne [name]#:  [id number] DATE OF BIRTH:  1956/03/16  DATE OF ADMISSION:  12/02/2014 DATE OF DISCHARGE:  12/10/2014  DIAGNOSES:  1. Atherosclerotic occlusive disease, bilateral lower extremities, with rest pain right lower extremity.  2. Distal aortic stenosis.  3. Ischemia, right foot.   OPERATIONS PERFORMED: Aortobifemoral bypass grafting with femoral endarterectomies and thrombectomy of existing right femoropopliteal dated 12/02/2014.   CONSULTATIONS:physician 12/03/2014 for medical management.   HISTORY: [name]. [name] is a 59 year old gentleman who presented to the hospital with ischemia of his right lower extremity. Noninvasive studies as well as angiography has demonstrated critical stenosis within the iliac arteries and distal aorta as well as critical stenosis within the common and profunda femoris arteries. There is occlusion of his femoropopliteal graft. The risks and benefits for revascularization were reviewed in detail. All questions have been answered. The patient has agreed to proceed. On the day of admission, he underwent successful aortobifemoral bypass grafting with endarterectomies of the femoral arteries and embolectomy of the femoropopliteal. Postoperatively, he was seen by medicine for medical management of his hypertension and diabetes.   Over the course of the next 7 days, he had a relatively uneventful postoperative course. He did have a bowel movement on postoperative day #6. He has been ambulating with minimal difficulty. He did have significant pain management issues initially, but is now tolerating oral agents only. The wound on his right calf did need attention. Initially, he was treated with Silvadene, but [facility] and he will be re-evaluated in the office as to whether any further surgical debridement is needed. On postoperative day #8, he is felt fit for discharge. He is discharged to home with home health. He will do Santyl to his  leg. His bilateral femoral and midline incision is clean, dry and intact and he will dress that with dry gauze. He may shower.   Pain medication is Percocet orally. The rest of his medications are as noted in the discharge papers. They are his home medications and unchanged. He is not to lift anything heavier than 10 pounds for the next several weeks. He is not to drive for 6 weeks. He will follow up in the office for staple removal.   ____________________________ Renford DillsGregory G. Schnier, MD ggs:TT D: 12/11/2014 20:32:57 ET T: 12/12/2014 11:41:26 ET JOB#: [id number]  cc: Renford DillsGregory G. Schnier, MD, <Dictator> Renford DillsGREGORY G SCHNIER MD ELECTRONICALLY SIGNED 01/05/2015 8:52

## 2014-12-28 ENCOUNTER — Other Ambulatory Visit: Payer: Self-pay

## 2014-12-28 ENCOUNTER — Encounter: Payer: Self-pay | Admitting: *Deleted

## 2014-12-28 DIAGNOSIS — E119 Type 2 diabetes mellitus without complications: Secondary | ICD-10-CM | POA: Diagnosis not present

## 2014-12-28 DIAGNOSIS — Z823 Family history of stroke: Secondary | ICD-10-CM | POA: Diagnosis not present

## 2014-12-28 DIAGNOSIS — I70221 Atherosclerosis of native arteries of extremities with rest pain, right leg: Secondary | ICD-10-CM | POA: Diagnosis not present

## 2014-12-28 DIAGNOSIS — I10 Essential (primary) hypertension: Secondary | ICD-10-CM | POA: Diagnosis not present

## 2014-12-28 DIAGNOSIS — Z9889 Other specified postprocedural states: Secondary | ICD-10-CM | POA: Diagnosis not present

## 2014-12-28 DIAGNOSIS — I70219 Atherosclerosis of native arteries of extremities with intermittent claudication, unspecified extremity: Secondary | ICD-10-CM | POA: Diagnosis not present

## 2014-12-28 DIAGNOSIS — Z87891 Personal history of nicotine dependence: Secondary | ICD-10-CM | POA: Diagnosis not present

## 2014-12-28 DIAGNOSIS — E785 Hyperlipidemia, unspecified: Secondary | ICD-10-CM | POA: Diagnosis not present

## 2014-12-28 DIAGNOSIS — I739 Peripheral vascular disease, unspecified: Secondary | ICD-10-CM | POA: Diagnosis present

## 2014-12-28 NOTE — OR Nursing (Signed)
Finally got a hold of pt via phone and gave directions regarding xarelto and asa. Pt verbalized understanding

## 2014-12-28 NOTE — OR Nursing (Signed)
Called Dr Kristeen Mansews office and spoke to [place] about Xarelto and ASA. Dr Wyn Quakerew not in office but she spoke with Dr Gilda CreaseSchnier and he said to stop Xarelto 1-2 days prior to surgery and continue ASA . Called pt back repeatedly and phone will ring once and then stop. Unable to let pt know about stopping Xarelto

## 2014-12-28 NOTE — Patient Instructions (Addendum)
  Your procedure is scheduled on: 12-30-14 Report to [facility] 2nd Floor To find out your arrival time please call [phone] between 1PM - 3PM on 12-29-14  Remember: Instructions that are not followed completely may result in serious medical risk, up to and including death, or upon the discretion of your surgeon and anesthesiologist your surgery may need to be rescheduled.    __x__ 1. Do not eat food or drink liquids after midnight. No gum chewing or hard candies.     __x__ 2. No Alcohol for 24 hours before or after surgery.   ____ 3. Bring all medications with you on the day of surgery if instructed.    __x__ 4. Notify your doctor if there is any change in your medical condition     (cold, fever, infections).     Do not wear jewelry, make-up, hairpins, clips or nail polish.  Do not wear lotions, powders, or perfumes. You may wear deodorant.  Do not shave 48 hours prior to surgery. Men may shave face and neck.  Do not bring valuables to the hospital.    [facility] belongings or valuables.               Contacts, dentures or bridgework may not be worn into surgery.  Leave your suitcase in the car. After surgery it may be brought to your room.  For patients admitted to the hospital, discharge time is determined by your treatment team.   Patients discharged the day of surgery will not be allowed to drive home.   Please read over the following fact sheets that you were given:     __x__ Take these medicines the morning of surgery with A SIP OF WATER:    1.Carvedilol  2. Allopurinol  3. Lisinopril  4. Simvastatin  5. Lyrica  6.  ____ Fleet Enema (as directed)   ____ Use CHG Soap as directed  ____ Use inhalers on the day of surgery  __x__ Stop metformin 2 days prior to surgery NOW   ____ Take 1/2 of usual insulin dose the night before surgery and none on the morning of surgery.   __x__ Stop Coumadin/Plavix/aspirin (STOP  XARELTO AND CONTINUE ASA PER DR SCHNIER-DO NOT TAKE ASA THE MORNING OF SURGERY)  ____ Stop Anti-inflammatories on    ____ Stop supplements until after surgery.    ____ Bring C-Pap to the hospital.

## 2014-12-29 ENCOUNTER — Encounter: Payer: Self-pay | Admitting: *Deleted

## 2014-12-30 ENCOUNTER — Encounter
Admission: RE | Disposition: A | Discharge status: Home / Self Care | Payer: Self-pay | Source: Ambulatory Visit | Attending: Vascular Surgery

## 2014-12-30 ENCOUNTER — Encounter: Payer: Self-pay | Admitting: *Deleted

## 2014-12-30 ENCOUNTER — Ambulatory Visit: Payer: 59 | Admitting: Anesthesiology

## 2014-12-30 ENCOUNTER — Ambulatory Visit
Admission: RE | Admit: 2014-12-30 | Discharge: 2014-12-30 | Disposition: A | Discharge status: Home / Self Care | Payer: 59 | Source: Ambulatory Visit | Attending: Vascular Surgery | Admitting: Vascular Surgery

## 2014-12-30 DIAGNOSIS — I70219 Atherosclerosis of native arteries of extremities with intermittent claudication, unspecified extremity: Secondary | ICD-10-CM | POA: Insufficient documentation

## 2014-12-30 DIAGNOSIS — Z87891 Personal history of nicotine dependence: Secondary | ICD-10-CM | POA: Insufficient documentation

## 2014-12-30 DIAGNOSIS — Z823 Family history of stroke: Secondary | ICD-10-CM | POA: Insufficient documentation

## 2014-12-30 DIAGNOSIS — Z9889 Other specified postprocedural states: Secondary | ICD-10-CM | POA: Insufficient documentation

## 2014-12-30 DIAGNOSIS — I70221 Atherosclerosis of native arteries of extremities with rest pain, right leg: Secondary | ICD-10-CM | POA: Insufficient documentation

## 2014-12-30 DIAGNOSIS — I10 Essential (primary) hypertension: Secondary | ICD-10-CM | POA: Insufficient documentation

## 2014-12-30 DIAGNOSIS — E785 Hyperlipidemia, unspecified: Secondary | ICD-10-CM | POA: Insufficient documentation

## 2014-12-30 DIAGNOSIS — E119 Type 2 diabetes mellitus without complications: Secondary | ICD-10-CM | POA: Insufficient documentation

## 2014-12-30 HISTORY — DX: Peripheral vascular disease, unspecified: I73.9

## 2014-12-30 HISTORY — DX: Polyneuropathy, unspecified: G62.9

## 2014-12-30 HISTORY — DX: Essential (primary) hypertension: I10

## 2014-12-30 HISTORY — DX: Acute myocardial infarction, unspecified: I21.9

## 2014-12-30 HISTORY — DX: Hyperlipidemia, unspecified: E78.5

## 2014-12-30 HISTORY — DX: Type 2 diabetes mellitus without complications: E11.9

## 2014-12-30 HISTORY — PX: WOUND DEBRIDEMENT: SHX247

## 2014-12-30 HISTORY — DX: Atherosclerotic heart disease of native coronary artery without angina pectoris: I25.10

## 2014-12-30 LAB — BASIC METABOLIC PANEL
ANION GAP: 8 (ref 5–15)
Anion gap: 7 (ref 5–15)
BUN: 46 mg/dL — AB (ref 6–20)
BUN: 39 mg/dL — AB (ref 6–20)
CO2: 27 mmol/L (ref 22–32)
CO2: 23 mmol/L (ref 22–32)
Calcium: 9.7 mg/dL (ref 8.9–10.3)
Calcium: 9.4 mg/dL (ref 8.9–10.3)
Chloride: 100 mmol/L — ABNORMAL LOW (ref 101–111)
Chloride: 105 mmol/L (ref 101–111)
Creatinine, Ser: 1.63 mg/dL — ABNORMAL HIGH (ref 0.61–1.24)
Creatinine, Ser: 1.38 mg/dL — ABNORMAL HIGH (ref 0.61–1.24)
GFR calc Af Amer: 52 mL/min — ABNORMAL LOW (ref >60)
GFR, EST NON AFRICAN AMERICAN: 45 mL/min — AB (ref >60)
GFR, EST NON AFRICAN AMERICAN: 55 mL/min — AB (ref >60)
GLUCOSE: 100 mg/dL — AB (ref 65–99)
Glucose, Bld: 88 mg/dL (ref 65–99)
POTASSIUM: 6.7 mmol/L — AB (ref 3.5–5.1)
POTASSIUM: 6.2 mmol/L — AB (ref 3.5–5.1)
SODIUM: 134 mmol/L — AB (ref 135–145)
SODIUM: 136 mmol/L (ref 135–145)

## 2014-12-30 LAB — PROTIME-INR
INR: 1.05
Prothrombin Time: 13.9 seconds (ref 11.4–15.0)

## 2014-12-30 LAB — POTASSIUM
POTASSIUM: 7.3
Potassium: 6 mmol/L — ABNORMAL HIGH (ref 3.5–5.1)

## 2014-12-30 LAB — CBC
HCT: 34.7 % — ABNORMAL LOW (ref 40.0–52.0)
HEMOGLOBIN: 11.2 g/dL — AB (ref 13.0–18.0)
MCH: 28.2 pg (ref 26.0–34.0)
MCHC: 32.4 g/dL (ref 32.0–36.0)
MCV: 87 fL (ref 80.0–100.0)
PLATELETS: 257 10*3/uL (ref 150–440)
RBC: 3.98 MIL/uL — AB (ref 4.40–5.90)
RDW: 14.2 % (ref 11.5–14.5)
WBC: 7.9 10*3/uL (ref 3.8–10.6)

## 2014-12-30 LAB — GLUCOSE, CAPILLARY
GLUCOSE-CAPILLARY: 93 mg/dL (ref 65–99)
Glucose-Capillary: 94 mg/dL (ref 65–99)

## 2014-12-30 LAB — APTT: aPTT: 36 seconds (ref 24–36)

## 2014-12-30 SURGERY — DEBRIDEMENT, WOUND
Anesthesia: General | Site: Leg Lower | Laterality: Right | Wound class: Dirty or Infected

## 2014-12-30 MED ORDER — HYDROMORPHONE HCL 1 MG/ML IJ SOLN
0.5000 mg | INTRAMUSCULAR | Status: DC | PRN
  Administered 2014-12-30 (×2): 0.5 mg via INTRAVENOUS

## 2014-12-30 MED ORDER — FENTANYL CITRATE (PF) 100 MCG/2ML IJ SOLN
INTRAMUSCULAR | Status: DC | PRN
  Administered 2014-12-30 (×4): 25 ug via INTRAVENOUS

## 2014-12-30 MED ORDER — MIDAZOLAM HCL 2 MG/2ML IJ SOLN
INTRAMUSCULAR | Status: DC | PRN
  Administered 2014-12-30: 2 mg via INTRAVENOUS

## 2014-12-30 MED ORDER — FENTANYL CITRATE (PF) 100 MCG/2ML IJ SOLN
25.0000 ug | INTRAMUSCULAR | Status: DC | PRN
  Administered 2014-12-30 (×4): 25 ug via INTRAVENOUS

## 2014-12-30 MED ORDER — EPHEDRINE SULFATE 50 MG/ML IJ SOLN
INTRAMUSCULAR | Status: DC | PRN
  Administered 2014-12-30: 10 mg via INTRAVENOUS
  Administered 2014-12-30 (×3): 5 mg via INTRAVENOUS

## 2014-12-30 MED ORDER — PROPOFOL 10 MG/ML IV BOLUS
INTRAVENOUS | Status: DC | PRN
  Administered 2014-12-30: 140 mg via INTRAVENOUS

## 2014-12-30 MED ORDER — OXYCODONE-ACETAMINOPHEN 7.5-325 MG PO TABS: 1.0000 | ORAL_TABLET | ORAL | Status: DC | PRN

## 2014-12-30 MED ORDER — CEFAZOLIN SODIUM 1-5 GM-% IV SOLN
1.0000 g | Freq: Once | INTRAVENOUS | Status: AC
  Administered 2014-12-30: 1 g via INTRAVENOUS

## 2014-12-30 MED ORDER — ONDANSETRON HCL 4 MG/2ML IJ SOLN: 4.0000 mg | Freq: Once | INTRAMUSCULAR | Status: DC | PRN

## 2014-12-30 MED ORDER — FENTANYL CITRATE (PF) 100 MCG/2ML IJ SOLN
INTRAMUSCULAR | Status: AC
  Filled 2014-12-30: qty 2

## 2014-12-30 MED ORDER — PHENYLEPHRINE HCL 10 MG/ML IJ SOLN
INTRAMUSCULAR | Status: DC | PRN
  Administered 2014-12-30: 100 ug via INTRAVENOUS
  Administered 2014-12-30: 250 ug via INTRAVENOUS
  Administered 2014-12-30: 100 ug via INTRAVENOUS
  Administered 2014-12-30: 200 ug via INTRAVENOUS

## 2014-12-30 MED ORDER — INSULIN REGULAR HUMAN 100 UNIT/ML IJ SOLN
10.0000 [IU] | Freq: Once | INTRAMUSCULAR | Status: AC
  Administered 2014-12-30: 10 [IU] via INTRAVENOUS
  Filled 2014-12-30 (×2): qty 0.1

## 2014-12-30 MED ORDER — ONDANSETRON HCL 4 MG/2ML IJ SOLN
INTRAMUSCULAR | Status: DC | PRN
Start: 2014-12-30 — End: 2014-12-30
  Administered 2014-12-30: 4 mg via INTRAVENOUS

## 2014-12-30 MED ORDER — DEXTROSE 50 % IV SOLN
1.0000 | Freq: Once | INTRAVENOUS | Status: AC
  Administered 2014-12-30: 50 mL via INTRAVENOUS
  Filled 2014-12-30: qty 50

## 2014-12-30 MED ORDER — CEFAZOLIN SODIUM 1-5 GM-% IV SOLN
INTRAVENOUS | Status: AC
  Administered 2014-12-30: 1 g via INTRAVENOUS
  Filled 2014-12-30: qty 50

## 2014-12-30 MED ORDER — FAMOTIDINE 20 MG PO TABS
ORAL_TABLET | ORAL | Status: AC
  Administered 2014-12-30: 20 mg via ORAL
  Filled 2014-12-30: qty 1

## 2014-12-30 MED ORDER — SODIUM CHLORIDE 0.9 % IV SOLN
INTRAVENOUS | Status: DC
  Administered 2014-12-30: 13:00:00 via INTRAVENOUS

## 2014-12-30 MED ORDER — OXYCODONE-ACETAMINOPHEN 5-325 MG PO TABS
ORAL_TABLET | ORAL | Status: AC
  Administered 2014-12-30: 1 via ORAL
  Filled 2014-12-30: qty 1

## 2014-12-30 MED ORDER — LIDOCAINE HCL (CARDIAC) 20 MG/ML IV SOLN
INTRAVENOUS | Status: DC | PRN
  Administered 2014-12-30: 100 mg via INTRAVENOUS

## 2014-12-30 MED ORDER — DEXTROSE 50 % IV SOLN
INTRAVENOUS | Status: AC
  Filled 2014-12-30: qty 50

## 2014-12-30 MED ORDER — OXYCODONE-ACETAMINOPHEN 5-325 MG PO TABS: 1.0000 | ORAL_TABLET | ORAL | Status: DC | PRN

## 2014-12-30 MED ORDER — FUROSEMIDE 10 MG/ML IJ SOLN
20.0000 mg | Freq: Once | INTRAMUSCULAR | Status: AC
  Administered 2014-12-30: 20 mg via INTRAVENOUS

## 2014-12-30 MED ORDER — FAMOTIDINE 20 MG PO TABS
20.0000 mg | ORAL_TABLET | Freq: Once | ORAL | Status: AC
  Administered 2014-12-30: 20 mg via ORAL

## 2014-12-30 MED ORDER — FUROSEMIDE 10 MG/ML IJ SOLN
INTRAMUSCULAR | Status: AC
  Filled 2014-12-30: qty 2

## 2014-12-30 MED ORDER — HYDROMORPHONE HCL 1 MG/ML IJ SOLN
INTRAMUSCULAR | Status: AC
  Filled 2014-12-30: qty 1

## 2014-12-30 MED ORDER — SODIUM CHLORIDE 0.9 % IV SOLN
1.0000 g | Freq: Once | INTRAVENOUS | Status: AC
  Administered 2014-12-30: 1 g via INTRAVENOUS
  Filled 2014-12-30: qty 10

## 2014-12-30 SURGICAL SUPPLY — 32 items
BNDG COHESIVE 6X5 TAN STRL LF (GAUZE/BANDAGES/DRESSINGS) ×3 IMPLANT
BRUSH SCRUB 4% CHG (MISCELLANEOUS) IMPLANT
CANISTER SUCT 1200ML W/VALVE (MISCELLANEOUS) ×3 IMPLANT
CHLORAPREP W/TINT 26ML (MISCELLANEOUS) IMPLANT
DRAPE INCISE IOBAN 66X45 STRL (DRAPES) ×3 IMPLANT
ELECT CAUTERY BLADE 6.4 (BLADE) ×3 IMPLANT
GLOVE BIO SURGEON STRL SZ7 (GLOVE) ×9 IMPLANT
GOWN STRL REUS W/ TWL LRG LVL3 (GOWN DISPOSABLE) ×1 IMPLANT
GOWN STRL REUS W/ TWL XL LVL3 (GOWN DISPOSABLE) ×1 IMPLANT
GOWN STRL REUS W/TWL LRG LVL3 (GOWN DISPOSABLE) ×2
GOWN STRL REUS W/TWL XL LVL3 (GOWN DISPOSABLE) ×2
HANDPIECE SUCTION TUBG SURGILV (MISCELLANEOUS) IMPLANT
IV NS 1000ML (IV SOLUTION)
IV NS 1000ML BAXH (IV SOLUTION) IMPLANT
KIT RM TURNOVER STRD PROC AR (KITS) ×3 IMPLANT
LABEL OR SOLS (LABEL) IMPLANT
NS IRRIG 500ML POUR BTL (IV SOLUTION) ×3 IMPLANT
PACK EXTREMITY ARMC (MISCELLANEOUS) ×3 IMPLANT
PAD GROUND ADULT SPLIT (MISCELLANEOUS) ×3 IMPLANT
PAD PREP 24X41 OB/GYN DISP (PERSONAL CARE ITEMS) ×3 IMPLANT
SCRUB POVIDONE IODINE 4 OZ (MISCELLANEOUS) ×3 IMPLANT
SOL PREP PVP 2OZ (MISCELLANEOUS) ×3
SOLUTION PREP PVP 2OZ (MISCELLANEOUS) ×1 IMPLANT
SPONGE LAP 18X18 5 PK (GAUZE/BANDAGES/DRESSINGS) IMPLANT
STOCKINETTE M/LG 89821 (MISCELLANEOUS) ×3 IMPLANT
SUT ETHILON 4-0 (SUTURE)
SUT ETHILON 4-0 FS2 18XMFL BLK (SUTURE)
SUT VIC AB 3-0 SH 27 (SUTURE)
SUT VIC AB 3-0 SH 27X BRD (SUTURE) IMPLANT
SUTURE ETHLN 4-0 FS2 18XMF BLK (SUTURE) IMPLANT
SWAB CULTURE AMIES ANAERIB BLU (MISCELLANEOUS) ×3 IMPLANT
SYR BULB EAR ULCER 3OZ GRN STR (SYRINGE) IMPLANT

## 2014-12-30 NOTE — Anesthesia Preprocedure Evaluation (Addendum)
Anesthesia Evaluation  Patient identified by MRN, date of birth, ID band Patient awake    Reviewed: Allergy & Precautions, NPO status , Patient's Chart, lab work & pertinent test results  History of Anesthesia Complications Negative for: history of anesthetic complications  Airway Mallampati: II  TM Distance: >3 FB Neck ROM: Full    Dental   Pulmonary former smoker (Quit x 8 years),          Cardiovascular hypertension, Pt. on medications and Pt. on home beta blockers + CAD, + Past MI (1980's) and + Peripheral Vascular Disease     Neuro/Psych  Neuromuscular disease (peripheral neuropathy)    GI/Hepatic   Endo/Other  diabetes, Type 2, Oral Hypoglycemic Agents  Renal/GU      Musculoskeletal   Abdominal   Peds  Hematology   Anesthesia Other Findings   Reproductive/Obstetrics                            Anesthesia Physical Anesthesia Plan  ASA: III  Anesthesia Plan: General   Post-op Pain Management:    Induction: Intravenous  Airway Management Planned: LMA  Additional Equipment:   Intra-op Plan:   Post-operative Plan:   Informed Consent: I have reviewed the patients History and Physical, chart, labs and discussed the procedure including the risks, benefits and alternatives for the proposed anesthesia with the patient or authorized representative who has indicated his/her understanding and acceptance.     Plan Discussed with:   Anesthesia Plan Comments:         Anesthesia Quick Evaluation

## 2014-12-30 NOTE — Transfer of Care (Signed)
Immediate Anesthesia Transfer of Care Note  Patient: Wayne HugueninCarl [name]  Procedure(s) Performed: Procedure(s): DEBRIDEMENT WOUND/RIGHT CALF I&D WITH WOUND VAC (Right)  Patient Location: PACU  Anesthesia Type:General  Level of Consciousness: awake, alert  and oriented  Airway & Oxygen Therapy: Patient Spontanous Breathing and Patient connected to face mask oxygen  Post-op Assessment: Report given to RN and Post -op Vital signs reviewed and stable  Post vital signs: Reviewed and stable  Last Vitals:  Filed Vitals:   12/30/14 1408  BP: 127/75  Pulse: 81  Temp: 36.3 C  Resp: 15    Complications: No apparent anesthesia complications

## 2014-12-30 NOTE — H&P (Signed)
[place] [facility] History & Physical Update  The patient was interviewed and re-examined.  The patient's previous History and Physical has been reviewed and is unchanged.  There is no change in the plan of care.  Ventura Leggitt, MD  12/30/2014, 12:58 PM

## 2014-12-30 NOTE — Op Note (Signed)
    OPERATIVE NOTE   PROCEDURE: 1. Irrigation and debridement of right calf wound for about 30 cm with placement of negative pressure dressing.  PRE-OPERATIVE DIAGNOSIS: Nonviable tissue and open wound right calf.  S/p right fem distal bypass and aortobifemoral bypass  POST-OPERATIVE DIAGNOSIS: Same as above  SURGEON: Festus BarrenJason Ceil Roderick, MD  ASSISTANT(S): none   ANESTHESIA: general  ESTIMATED BLOOD LOSS: minimal  FINDING(S): None  SPECIMEN(S):  noen  INDICATIONS:   Wayne HugueninCarl Lager is a 59 y.o. male who presents with severe peripheral arterial disease who is status post aortobifemoral bypass and right femoral distal bypass. His right anterior lateral calf wound has not healed and has nonviable tissue in the skin, soft tissue, and muscle. We are bringing him to the operating room to debride back nonviable tissue and place a VAC dressing..  DESCRIPTION: After obtaining full informed written consent, the patient was brought back to the operating room and placed supine upon the operating table.  The patient received IV antibiotics prior to induction.  After obtaining adequate anesthesia, the patient was prepped and draped in the standard fashion.  The wound was then debrided and  excisional debridement was performed to  the skin, soft tissue, and some nonviable muscle in the anterior tibialis muscle to remove all clearly non-viable tissue.  The tissue was taken back to bleeding tissue that appeared viable.  The debridement was performed with Metzenbaum and Mayo scissors and encompassed an area of approximately 30 cm2.  The wound was irrigated copiously with sterile saline.  After all clearly non-viable tissue was removed, and the remaining tissue appeared healthy and viable.  No graft was found to be exposed and no clear signs of infection were seen.  I then placed a VAC dressing with the sponge cut to fit the wound and stips of Ioban used to create an occlusive seal with the suction tubing. The patient  was then awakened from anesthesia and taken to the recovery room in stable condition having tolerated the procedure well.  COMPLICATIONS: none  CONDITION: stable  Krystalle Pilkington  12/30/2014, 2:11 PM

## 2014-12-30 NOTE — Discharge Instructions (Signed)
AMBULATORY SURGERY  °DISCHARGE INSTRUCTIONS ° ° °1) The drugs that you were given will stay in your system until tomorrow so for the next 24 hours you should not: ° °A) Drive an automobile °B) Make any legal decisions °C) Drink any alcoholic beverage ° ° °2) You may resume regular meals tomorrow.  Today it is better to start with liquids and gradually work up to solid foods. ° °You may eat anything you prefer, but it is better to start with liquids, then soup and crackers, and gradually work up to solid foods. ° ° °3) Please notify your doctor immediately if you have any unusual bleeding, trouble breathing, redness and pain at the surgery site, drainage, fever, or pain not relieved by medication. °4)  ° °5) Your post-operative visit with Dr.    Porfilio                 °           °     is: Date:                        Time:   ° °Please call to schedule your post-operative visit. ° °6) Additional Instructions: °7)  °

## 2014-12-30 NOTE — Discharge Summary (Signed)
Spoke with Dr. Wyn Quakerew. He stated that he wants an I-Stat potassium and if the patients potassium is less than 6.5 he can be discharged home. Order entered.

## 2014-12-30 NOTE — OR Nursing (Signed)
Repeat K+ in PACU was 5.1.

## 2014-12-30 NOTE — OR Nursing (Signed)
Lab called to report critical potassium level of 6.7, patient had been taken back to OR Dr. Henrene HawkingKephart and Dr. Wyn Quakerew aware that labs were pending.  Called critical value immediately to OR room to report to Dr. Wyn Quakerew.

## 2014-12-30 NOTE — Anesthesia Postprocedure Evaluation (Signed)
  Anesthesia Post-op Note  Patient: Wayne HugueninCarl Occhipinti  Procedure(s) Performed: Procedure(s): DEBRIDEMENT WOUND/RIGHT CALF I&D WITH WOUND VAC (Right)  Anesthesia type:General  Patient location: PACU  Post pain: Pain level controlled  Post assessment: Post-op Vital signs reviewed, Patient's Cardiovascular Status Stable, Respiratory Function Stable, Patent Airway and No signs of Nausea or vomiting  Post vital signs: Reviewed and stable  Last Vitals:  Filed Vitals:   12/30/14 1411  BP: 126/83  Pulse: 86  Temp:   Resp: 16    Level of consciousness: awake, alert  and patient cooperative  Complications: No apparent anesthesia complications

## 2014-12-31 ENCOUNTER — Encounter: Payer: Self-pay | Admitting: Vascular Surgery

## 2015-02-01 ENCOUNTER — Encounter: Payer: Self-pay | Admitting: *Deleted

## 2015-02-01 ENCOUNTER — Encounter
Admission: RE | Disposition: A | Discharge status: Home / Self Care | Payer: Self-pay | Source: Ambulatory Visit | Attending: Vascular Surgery

## 2015-02-01 ENCOUNTER — Ambulatory Visit
Admission: RE | Admit: 2015-02-01 | Discharge: 2015-02-01 | Disposition: A | Discharge status: Home / Self Care | Payer: 59 | Source: Ambulatory Visit | Attending: Vascular Surgery | Admitting: Vascular Surgery

## 2015-02-01 DIAGNOSIS — Z119 Encounter for screening for infectious and parasitic diseases, unspecified: Secondary | ICD-10-CM | POA: Diagnosis not present

## 2015-02-01 DIAGNOSIS — L97509 Non-pressure chronic ulcer of other part of unspecified foot with unspecified severity: Secondary | ICD-10-CM | POA: Diagnosis not present

## 2015-02-01 DIAGNOSIS — Z79899 Other long term (current) drug therapy: Secondary | ICD-10-CM | POA: Insufficient documentation

## 2015-02-01 DIAGNOSIS — I70232 Atherosclerosis of native arteries of right leg with ulceration of calf: Secondary | ICD-10-CM | POA: Insufficient documentation

## 2015-02-01 DIAGNOSIS — I10 Essential (primary) hypertension: Secondary | ICD-10-CM | POA: Insufficient documentation

## 2015-02-01 DIAGNOSIS — E785 Hyperlipidemia, unspecified: Secondary | ICD-10-CM | POA: Insufficient documentation

## 2015-02-01 DIAGNOSIS — I252 Old myocardial infarction: Secondary | ICD-10-CM | POA: Diagnosis not present

## 2015-02-01 DIAGNOSIS — L97219 Non-pressure chronic ulcer of right calf with unspecified severity: Secondary | ICD-10-CM | POA: Diagnosis not present

## 2015-02-01 HISTORY — PX: PERIPHERAL VASCULAR CATHETERIZATION: SHX172C

## 2015-02-01 LAB — CREATININE, SERUM: Creatinine, Ser: 0.9 mg/dL (ref 0.61–1.24)

## 2015-02-01 LAB — GLUCOSE, CAPILLARY: Glucose-Capillary: 87 mg/dL (ref 65–99)

## 2015-02-01 LAB — BUN: BUN: 14 mg/dL (ref 6–20)

## 2015-02-01 SURGERY — ABDOMINAL AORTOGRAM W/LOWER EXTREMITY
Laterality: Right | Wound class: Clean

## 2015-02-01 MED ORDER — MIDAZOLAM HCL 5 MG/5ML IJ SOLN
INTRAMUSCULAR | Status: AC
  Filled 2015-02-01: qty 5

## 2015-02-01 MED ORDER — METOPROLOL TARTRATE 1 MG/ML IV SOLN
2.0000 mg | INTRAVENOUS | Status: DC | PRN
Start: 2015-02-01 — End: 2015-02-01

## 2015-02-01 MED ORDER — LIDOCAINE-EPINEPHRINE (PF) 1 %-1:200000 IJ SOLN
INTRAMUSCULAR | Status: AC
  Filled 2015-02-01: qty 30

## 2015-02-01 MED ORDER — ATROPINE SULFATE 0.1 MG/ML IJ SOLN: 0.5000 mg | INTRAMUSCULAR | Status: DC | PRN

## 2015-02-01 MED ORDER — HEPARIN (PORCINE) IN NACL 2-0.9 UNIT/ML-% IJ SOLN
INTRAMUSCULAR | Status: AC
  Filled 2015-02-01: qty 1000

## 2015-02-01 MED ORDER — HYDROMORPHONE HCL 1 MG/ML IJ SOLN: 1.0000 mg | Freq: Once | INTRAMUSCULAR | Status: DC | PRN

## 2015-02-01 MED ORDER — ACETAMINOPHEN 325 MG RE SUPP: 325.0000 mg | RECTAL | Status: DC | PRN

## 2015-02-01 MED ORDER — MIDAZOLAM HCL 2 MG/2ML IJ SOLN
INTRAMUSCULAR | Status: DC | PRN
  Administered 2015-02-01 (×2): 1 mg via INTRAVENOUS
  Administered 2015-02-01: 2 mg via INTRAVENOUS
  Administered 2015-02-01 (×2): 1 mg via INTRAVENOUS

## 2015-02-01 MED ORDER — FENTANYL CITRATE (PF) 100 MCG/2ML IJ SOLN
INTRAMUSCULAR | Status: AC
  Filled 2015-02-01: qty 2

## 2015-02-01 MED ORDER — CEFAZOLIN SODIUM 1-5 GM-% IV SOLN
1.0000 g | Freq: Once | INTRAVENOUS | Status: AC
  Administered 2015-02-01: 1 g via INTRAVENOUS

## 2015-02-01 MED ORDER — ONDANSETRON HCL 4 MG/2ML IJ SOLN: 4.0000 mg | INTRAMUSCULAR | Status: DC | PRN

## 2015-02-01 MED ORDER — ONDANSETRON HCL 4 MG/2ML IJ SOLN: 4.0000 mg | Freq: Four times a day (QID) | INTRAMUSCULAR | Status: DC | PRN

## 2015-02-01 MED ORDER — LABETALOL HCL 5 MG/ML IV SOLN: 10.0000 mg | INTRAVENOUS | Status: DC | PRN

## 2015-02-01 MED ORDER — HEPARIN SODIUM (PORCINE) 1000 UNIT/ML IJ SOLN
INTRAMUSCULAR | Status: DC | PRN
  Administered 2015-02-01: 2000 [IU] via INTRAVENOUS

## 2015-02-01 MED ORDER — HYDRALAZINE HCL 20 MG/ML IJ SOLN: 5.0000 mg | INTRAMUSCULAR | Status: DC | PRN

## 2015-02-01 MED ORDER — FENTANYL CITRATE (PF) 100 MCG/2ML IJ SOLN
INTRAMUSCULAR | Status: DC | PRN
  Administered 2015-02-01: 25 ug via INTRAVENOUS
  Administered 2015-02-01: 50 ug via INTRAVENOUS
  Administered 2015-02-01: 25 ug via INTRAVENOUS

## 2015-02-01 MED ORDER — SODIUM CHLORIDE 0.9 % IV SOLN
INTRAVENOUS | Status: DC
  Administered 2015-02-01: 14:00:00 via INTRAVENOUS

## 2015-02-01 MED ORDER — PHENOL 1.4 % MT LIQD: 1.0000 | OROMUCOSAL | Status: DC | PRN

## 2015-02-01 MED ORDER — CEFAZOLIN SODIUM 1-5 GM-% IV SOLN
INTRAVENOUS | Status: AC
  Filled 2015-02-01: qty 50

## 2015-02-01 MED ORDER — GUAIFENESIN-DM 100-10 MG/5ML PO SYRP: 15.0000 mL | ORAL_SOLUTION | ORAL | Status: DC | PRN

## 2015-02-01 MED ORDER — HEPARIN SODIUM (PORCINE) 1000 UNIT/ML IJ SOLN
INTRAMUSCULAR | Status: AC
  Filled 2015-02-01: qty 1

## 2015-02-01 MED ORDER — IOHEXOL 350 MG/ML SOLN
INTRAVENOUS | Status: DC | PRN
  Administered 2015-02-01: 75 mL via INTRA_ARTERIAL

## 2015-02-01 MED ORDER — ACETAMINOPHEN 325 MG PO TABS: 325.0000 mg | ORAL_TABLET | ORAL | Status: DC | PRN

## 2015-02-01 MED ORDER — HYDROMORPHONE HCL 1 MG/ML IJ SOLN: 0.5000 mg | INTRAMUSCULAR | Status: DC | PRN

## 2015-02-01 MED ORDER — OXYCODONE-ACETAMINOPHEN 5-325 MG PO TABS: 1.0000 | ORAL_TABLET | ORAL | Status: DC | PRN

## 2015-02-01 SURGICAL SUPPLY — 9 items
CANNULA 5F STIFF (CANNULA) ×5 IMPLANT
CATH ROYAL FLUSH PIG 5F 70CM (CATHETERS) ×5 IMPLANT
GLIDECATH 4FR STR (CATHETERS) ×5 IMPLANT
GLIDEWIRE ADV .035X180CM (WIRE) ×5 IMPLANT
GUIDEWIRE AMPLATZ SHORT (WIRE) ×5 IMPLANT
PACK ANGIOGRAPHY (CUSTOM PROCEDURE TRAY) ×5 IMPLANT
SHEATH AVANTI 4FRX11 (SHEATH) ×5 IMPLANT
SHEATH BRITE TIP 5FRX11 (SHEATH) ×5 IMPLANT
WIRE J 3MM .035X145CM (WIRE) ×5 IMPLANT

## 2015-02-01 NOTE — CV Procedure (Signed)
90F sheath pulled from right groin. ACT result 134.  Groin stable with no oozing, bruising, or hematoma noted.  Gauze and tegaderm applied to puncture site.  BP 124/87, Sat 100%, HR77

## 2015-02-01 NOTE — CV Procedure (Signed)
[facility] Percutaneous Study/Intervention Procedural Note   Date of Surgery: 02/01/2015  Surgeon(s):Yona Stansbury   Assistants:none  Pre-operative Diagnosis: PAD with ulceration right lower extremity. Status post right femoral to anterior tibial bypass and aortobifemoral bypass with femoral endarterectomies  Post-operative diagnosis: Same  Procedure(s) Performed: 1. Ultrasound guidance for vascular access bilateral femoral arteries 2. Catheter placement into aorta from the right femoral approach 3. Aortogram and selective right lower extremity angiogram   EBL: 25 cc  Indications: Patient is a 59 year old male with an extensive history of vascular disease and previous treatment. He has previously undergone an aortobifemoral bypass and a right femoral to anterior tibial bypass. He still has a nonhealing ulceration on the right leg with ischemic rest pain. The patient has noninvasive study showing significantly reduced ABIs bilaterally. The patient is brought in for angiography for further evaluation and potential treatment. Risks and benefits are discussed and informed consent is obtained  Procedure: The patient was identified and appropriate procedural time out was performed. The patient was then placed supine on the table and prepped and draped in the usual sterile fashion. Ultrasound was used to evaluate the left femoral artery. A digital ultrasound image was acquired. A micropuncture needle was used to access the hood of the graft in the left common femoral artery under direct ultrasound guidance and a permanent image was performed. A micropuncture wire was unable to be advanced without resistance and an image was performed through the needle which showed that the graft appeared to be occluded in the left limb.I then abandoned trying to approach from the left side and elected to take pictures  from a right femoral approach to evaluate for future options. The micropuncture needle was used to access the left common femoral artery at the hood of the graft and a permanent image was recorded under ultrasound guidance. I was able to navigate the micropuncture wire up the limb of the graft without difficulty and exchanged for a 4 French sheath. Imaging through the 4 French sheath showed the distal portion of the aortobifemoral bypass to be widely patent with a patulous femoral artery and a patent profunda femoris artery. Lower extremity runoff was completed with a long segment SFA and popliteal occlusion which was known. The anterior tibial artery and posterior tibial arteries reconstituted in the mid calf and continuous to the foot. I then placed a 4 French straight catheter into the aorta and evaluated the proximal portion of the aorta in the abdomen as well as the aortobifemoral bypass. The proximal anastomosis and proximal aorta appeared widely patent. The left limb of the aortobifemoral bypass was indeed occluded. The right limb of the bypass appeared to be widely patent. At this point, no percutaneous intervention was going to be of any benefit and only consideration for a redo right femoral to distal bypass would be likely to help his right lower extremity. I elected to terminate the procedure. The sheath was removed and pressure was held with a sterile dressing placed. Findings:  Aortogram: Left limb of the aortobifemoral bypass is occluded, right limb is patent, aorta is patent  Right Lower Extremity: Right femoral to anterior tibial bypass is occluded with distal reconstitution of the anterior tibial and posterior tibial arteries   Disposition: Patient was taken to the recovery room in stable condition having tolerated the procedure well.  Complications: None  Tarrie Mcmichen 02/01/2015 4:07 PM

## 2015-02-01 NOTE — Discharge Instructions (Signed)

## 2015-02-01 NOTE — H&P (Signed)
[place] [facility] History & Physical Update  The patient was interviewed and re-examined.  The patient's previous History and Physical has been reviewed and is unchanged.  There is no change in the plan of care.  Quill Grinder, MD  02/01/2015, 1:19 PM

## 2015-02-04 ENCOUNTER — Encounter: Payer: Self-pay | Admitting: Vascular Surgery

## 2016-06-27 ENCOUNTER — Other Ambulatory Visit (INDEPENDENT_AMBULATORY_CARE_PROVIDER_SITE_OTHER): Payer: Self-pay | Admitting: Vascular Surgery

## 2016-06-27 DIAGNOSIS — I739 Peripheral vascular disease, unspecified: Secondary | ICD-10-CM

## 2016-07-03 ENCOUNTER — Ambulatory Visit (INDEPENDENT_AMBULATORY_CARE_PROVIDER_SITE_OTHER): Payer: BLUE CROSS/BLUE SHIELD | Admitting: Vascular Surgery

## 2016-07-03 ENCOUNTER — Encounter (INDEPENDENT_AMBULATORY_CARE_PROVIDER_SITE_OTHER): Payer: Self-pay | Admitting: Vascular Surgery

## 2016-07-03 ENCOUNTER — Ambulatory Visit (INDEPENDENT_AMBULATORY_CARE_PROVIDER_SITE_OTHER): Payer: BLUE CROSS/BLUE SHIELD

## 2016-07-03 VITALS — BP 157/89 | HR 89 | Resp 16 | Ht 69.0 in | Wt 194.0 lb

## 2016-07-03 DIAGNOSIS — I739 Peripheral vascular disease, unspecified: Secondary | ICD-10-CM

## 2016-07-03 DIAGNOSIS — I70219 Atherosclerosis of native arteries of extremities with intermittent claudication, unspecified extremity: Secondary | ICD-10-CM

## 2016-07-03 DIAGNOSIS — I70232 Atherosclerosis of native arteries of right leg with ulceration of calf: Secondary | ICD-10-CM | POA: Diagnosis not present

## 2016-07-03 DIAGNOSIS — M79609 Pain in unspecified limb: Secondary | ICD-10-CM

## 2016-07-03 DIAGNOSIS — E119 Type 2 diabetes mellitus without complications: Secondary | ICD-10-CM | POA: Diagnosis not present

## 2016-07-03 DIAGNOSIS — E785 Hyperlipidemia, unspecified: Secondary | ICD-10-CM | POA: Diagnosis not present

## 2016-07-03 DIAGNOSIS — I159 Secondary hypertension, unspecified: Secondary | ICD-10-CM | POA: Diagnosis not present

## 2016-07-04 DIAGNOSIS — I10 Essential (primary) hypertension: Secondary | ICD-10-CM | POA: Insufficient documentation

## 2016-07-04 DIAGNOSIS — I739 Peripheral vascular disease, unspecified: Secondary | ICD-10-CM | POA: Insufficient documentation

## 2016-07-04 DIAGNOSIS — E119 Type 2 diabetes mellitus without complications: Secondary | ICD-10-CM | POA: Insufficient documentation

## 2016-07-04 DIAGNOSIS — E785 Hyperlipidemia, unspecified: Secondary | ICD-10-CM | POA: Insufficient documentation

## 2016-07-04 NOTE — Progress Notes (Signed)
Subjective:    Patient ID: Wayne [name], male    DOB: 03/21/1956, 60 y.o.   MRN: [id number] Chief Complaint  Patient presents with  . Re-evaluation    6 month follow up   Patient presents for a six month lower extremity atherosclerotic disease follow up. The patient underwent an ABI which showed Right ABI: 0.61 and Left 0.73 (on 12/29/15, Right ABI: 0.66 and Left 0.73). A right lower extremity arterial duplex is notable for probable occlusion of the bypass, triphasic flow in profunda, monophasic flow in calf vessels. The patient denies rest pain or ulcers to his feet / toes. He does experiences some claudication however it is not lifestyle limiting.    Review of Systems  Constitutional: Negative.   HENT: Negative.   Eyes: Negative.   Respiratory: Negative.   Cardiovascular:       Right Lower Extremity Claudication  Gastrointestinal: Negative.   Endocrine: Negative.   Genitourinary: Negative.   Musculoskeletal: Negative.   Skin: Negative.   Allergic/Immunologic: Negative.   Neurological: Negative.   Hematological: Negative.   Psychiatric/Behavioral: Negative.        Objective:   Physical Exam  Constitutional: He is oriented to person, place, and time. He appears well-developed and well-nourished.  HENT:  Head: Normocephalic and atraumatic.  Right Ear: External ear normal.  Left Ear: External ear normal.  Eyes: Conjunctivae and EOM are normal. Pupils are equal, round, and reactive to light.  Neck: Normal range of motion.  Cardiovascular: Normal rate, regular rhythm, normal heart sounds and intact distal pulses.   Pulses:      Radial pulses are 2+ on the right side, and 2+ on the left side.       Popliteal pulses are 0 on the right side, and 1+ on the left side.       Dorsalis pedis pulses are 0 on the right side, and 1+ on the left side.       Posterior tibial pulses are 0 on the right side, and 1+ on the left side.  No ulcerations noted.   Pulmonary/Chest: Effort normal and  breath sounds normal.  Abdominal: Soft. Bowel sounds are normal.  Musculoskeletal: Normal range of motion. He exhibits no edema.  Neurological: He is alert and oriented to person, place, and time.  Skin: Skin is warm and dry.  Psychiatric: He has a normal mood and affect. His behavior is normal. Judgment and thought content normal.   BP (!) 157/89 (BP Location: Right Arm)   Pulse 89   Resp 16   Ht 5\' 9"  (1.753 m)   Wt 194 lb (88 kg)   BMI 28.65 kg/m   Past Medical History:  Diagnosis Date  . Coronary artery disease   . Diabetes mellitus without complication (HCC)   . Hyperlipidemia   . Hypertension   . Myocardial infarction 1996  . Peripheral neuropathy (HCC)   . PVD (peripheral vascular disease) (HCC)    Social History   Social History  . Marital status: Married    Spouse name: N/A  . Number of children: N/A  . Years of education: N/A   Occupational History  . Not on file.   Social History Main Topics  . Smoking status: Former Smoker    Packs/day: 2.00    Years: 20.00    Quit date: 12/28/2006  . Smokeless tobacco: Not on file  . Alcohol use No  . Drug use: No  . Sexual activity: Not on file   Other Topics  Concern  . Not on file   Social History Narrative  . No narrative on file   Past Surgical History:  Procedure Laterality Date  . Aortobifemoral bypass    . PERIPHERAL VASCULAR CATHETERIZATION N/A 02/01/2015   Procedure: Abdominal Aortogram w/Lower Extremity;  Surgeon: Annice NeedyJason S Dew, MD;  Location: [facility] INVASIVE CV LAB;  Service: Cardiovascular;  Laterality: N/A;  . PERIPHERAL VASCULAR CATHETERIZATION  02/01/2015   Procedure: Lower Extremity Intervention;  Surgeon: Annice NeedyJason S Dew, MD;  Location: [facility] INVASIVE CV LAB;  Service: Cardiovascular;;  . Right leg stents    . VASCULAR SURGERY    . WOUND DEBRIDEMENT Right 12/30/2014   Procedure: DEBRIDEMENT WOUND/RIGHT CALF I&D WITH WOUND VAC;  Surgeon: Annice NeedyJason S Dew, MD;  Location: [facility] ORS;  Service: Vascular;   Laterality: Right;   No family history on file.  No Known Allergies     Assessment & Plan:  Patient presents for a six month lower extremity atherosclerotic disease follow up. The patient underwent an ABI which showed Right ABI: 0.61 and Left 0.73 (on 12/29/15, Right ABI: 0.66 and Left 0.73). A right lower extremity arterial duplex is notable for probable occlusion of the bypass, triphasic flow in profunda, monophasic flow in calf vessels. The patient denies rest pain or ulcers to his feet / toes. He does experiences some claudication however it is not lifestyle limiting.   1. PAD (peripheral artery disease) (HCC) - Stable Symptoms stable. Small decrease in ABI. Bypass possibly occluded. We discussed moving forward with an angiogram however the patient is refusing at this time. He will cal the office if his pain worsens. Patient to follow up in six months.   2. Secondary hypertension - Stable Encouraged good control as its slows the progression of atherosclerotic disease  3. Diabetes mellitus type 2 in nonobese (HCC) - Stable Encouraged good control as its slows the progression of atherosclerotic disease  4. Hyperlipidemia, unspecified hyperlipidemia type - Stable Encouraged good control as its slows the progression of atherosclerotic disease  Current Outpatient Prescriptions on File Prior to Visit  Medication Sig Dispense Refill  . allopurinol (ZYLOPRIM) 100 MG tablet Take 100 mg by mouth daily.    Marland Kitchen. aspirin 81 MG tablet Take 81 mg by mouth daily.    . carvedilol (COREG) 6.25 MG tablet Take 6.25 mg by mouth every morning.    Marland Kitchen. lisinopril (PRINIVIL,ZESTRIL) 20 MG tablet Take 20 mg by mouth every morning.    . metFORMIN (GLUCOPHAGE) 500 MG tablet Take 500 mg by mouth at bedtime.    Marland Kitchen. oxyCODONE-acetaminophen (PERCOCET) 7.5-325 MG per tablet Take 1 tablet by mouth every 6 (six) hours as needed for severe pain.    . rivaroxaban (XARELTO) 10 MG TABS tablet Take 10 mg by mouth daily.    .  simvastatin (ZOCOR) 40 MG tablet Take 40 mg by mouth every morning.    Marland Kitchen. spironolactone (ALDACTONE) 25 MG tablet Take 25 mg by mouth daily.     No current facility-administered medications on file prior to visit.     There are no Patient Instructions on file for this visit. No Follow-up on file.   Marleah Beever A Angelene Rome, PA-C

## 2016-12-19 IMAGING — CR DG ABDOMEN 1V
1 series · 2 of 2 positions shown · non-contrast
Comparison: None.

CLINICAL DATA: Abdominal pain and distension

EXAM:
ABDOMEN - 1 VIEW

[Series 1: supine kub · 0.17mm/px · 2 of 2 slices shown]
[im 1/2]
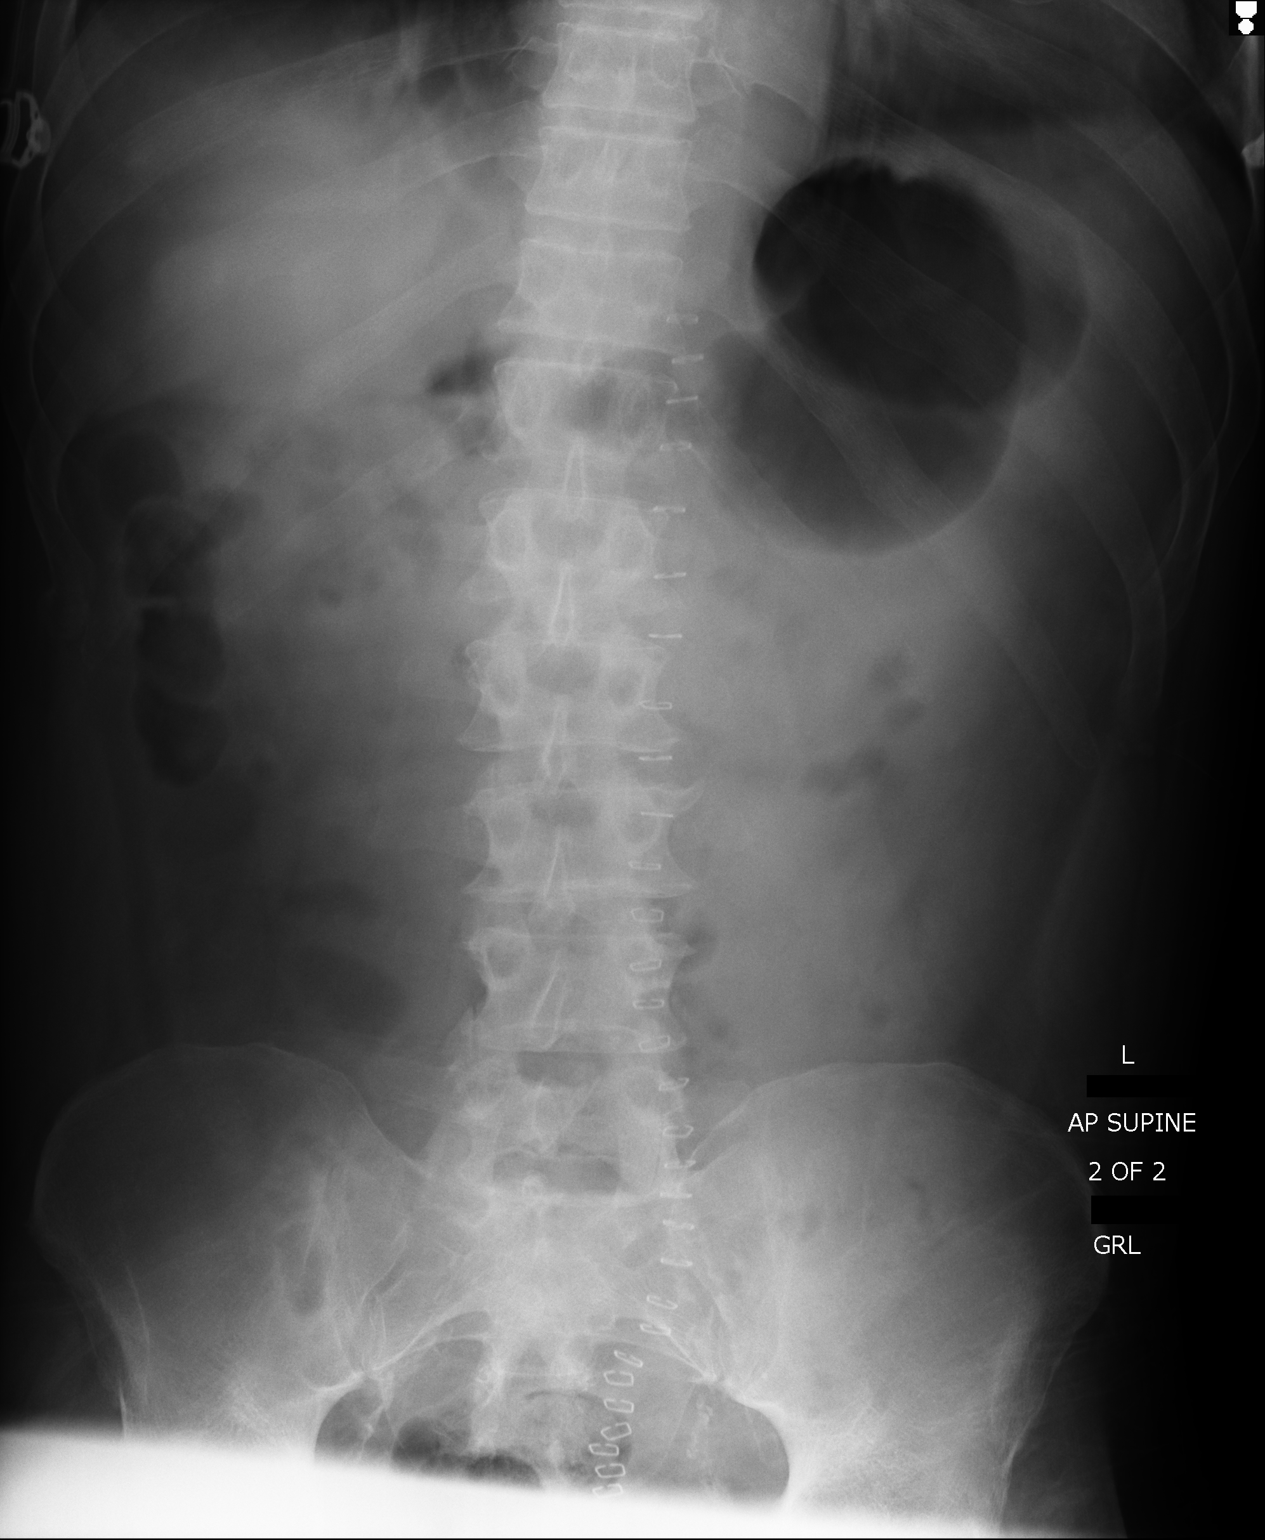
[im 2/2]
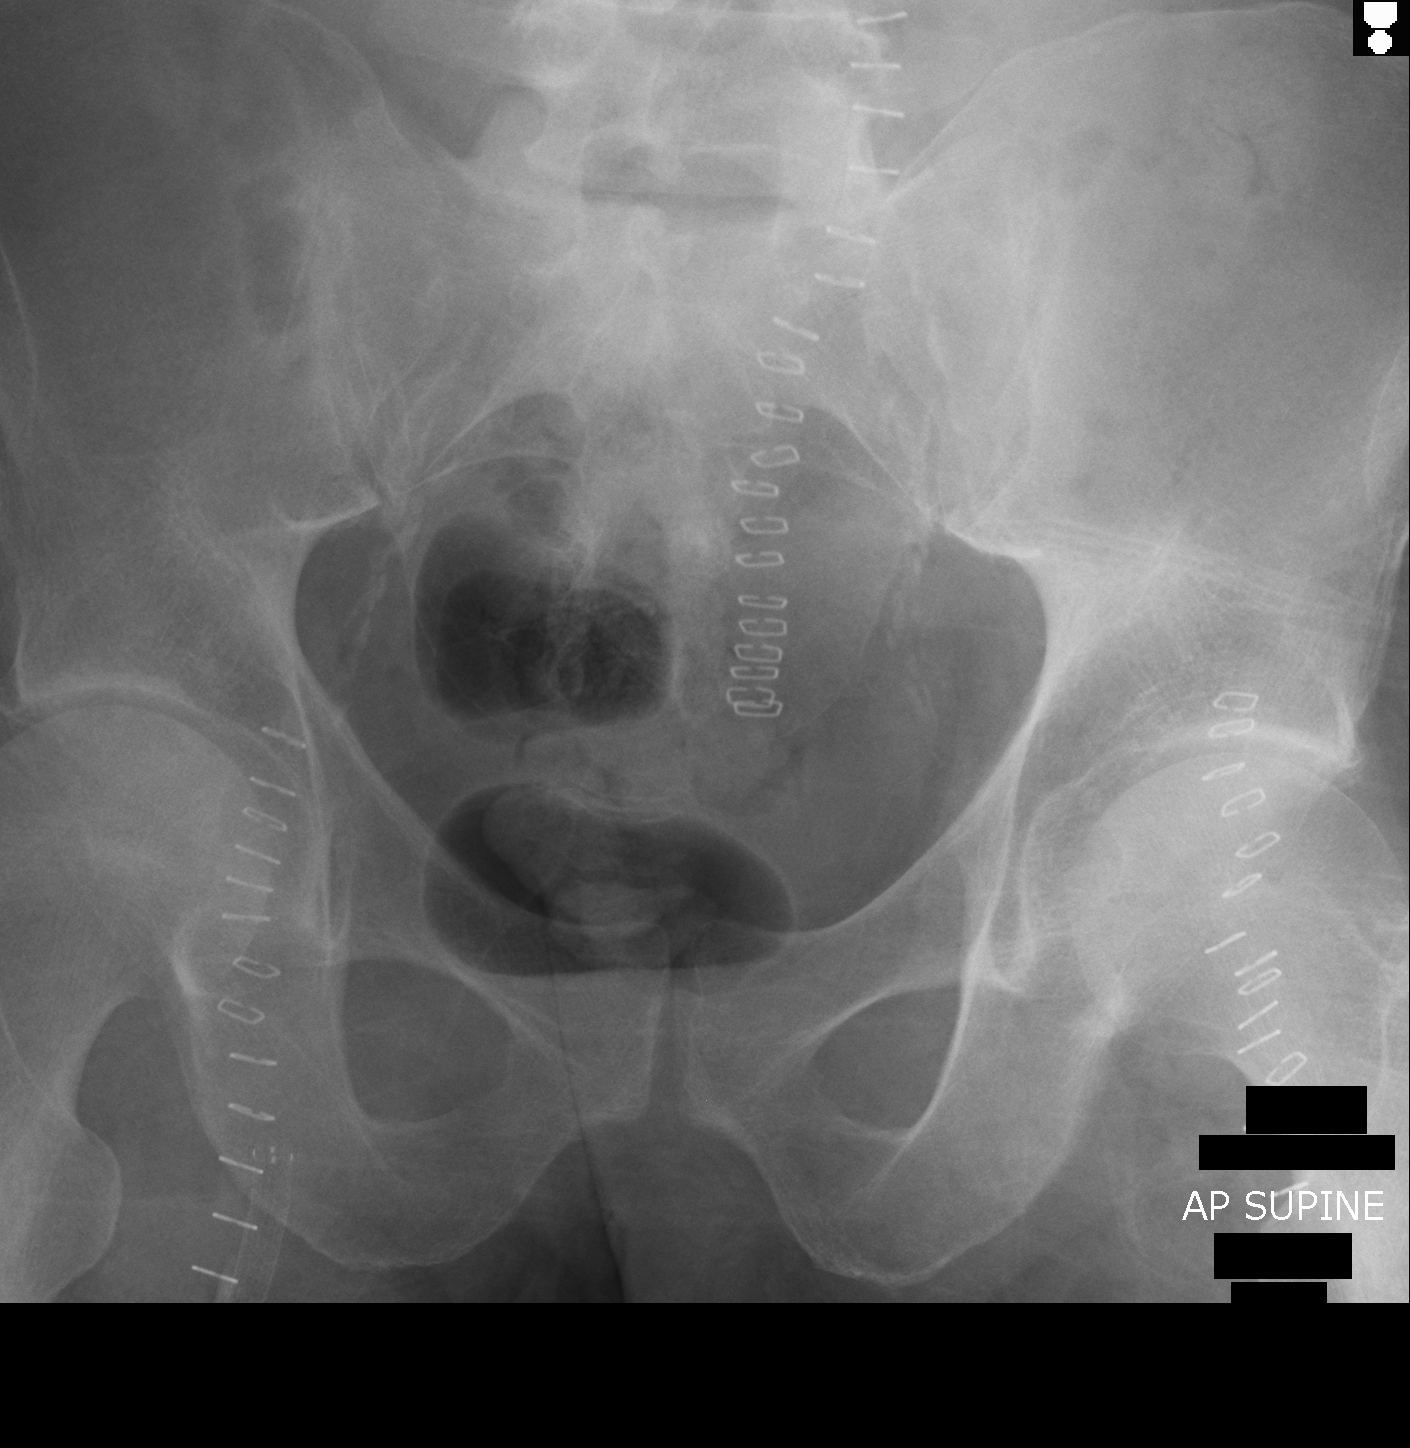

[2 of 2 positions shown; findings below may reference images not displayed]

FINDINGS: Scattered large and small bowel gas is noted. Postoperative changes
are seen. No free air is noted. No abnormal mass or abnormal
calcifications are identified.
IMPRESSION: Nonspecific abdomen.

## 2017-01-05 ENCOUNTER — Ambulatory Visit (INDEPENDENT_AMBULATORY_CARE_PROVIDER_SITE_OTHER): Payer: BLUE CROSS/BLUE SHIELD

## 2017-01-05 ENCOUNTER — Encounter (INDEPENDENT_AMBULATORY_CARE_PROVIDER_SITE_OTHER): Payer: Self-pay | Admitting: Vascular Surgery

## 2017-01-05 ENCOUNTER — Ambulatory Visit (INDEPENDENT_AMBULATORY_CARE_PROVIDER_SITE_OTHER): Payer: BLUE CROSS/BLUE SHIELD | Admitting: Vascular Surgery

## 2017-01-05 VITALS — BP 124/87 | HR 87 | Resp 16 | Wt 199.0 lb

## 2017-01-05 DIAGNOSIS — E785 Hyperlipidemia, unspecified: Secondary | ICD-10-CM | POA: Diagnosis not present

## 2017-01-05 DIAGNOSIS — I739 Peripheral vascular disease, unspecified: Secondary | ICD-10-CM

## 2017-01-05 DIAGNOSIS — I159 Secondary hypertension, unspecified: Secondary | ICD-10-CM | POA: Diagnosis not present

## 2017-01-05 DIAGNOSIS — E119 Type 2 diabetes mellitus without complications: Secondary | ICD-10-CM | POA: Diagnosis not present

## 2017-01-05 NOTE — Assessment & Plan Note (Signed)
blood glucose control important in reducing the progression of atherosclerotic disease. Also, involved in wound healing. On appropriate medications.  

## 2017-01-05 NOTE — Assessment & Plan Note (Signed)
blood pressure control important in reducing the progression of atherosclerotic disease. On appropriate oral medications.  

## 2017-01-05 NOTE — Assessment & Plan Note (Signed)
lipid control important in reducing the progression of atherosclerotic disease. Continue statin therapy  

## 2017-01-05 NOTE — Progress Notes (Signed)
MRN : [id number]  Wayne [name] is a 61 y.o. (11/16/1955) male who presents with chief complaint of  Chief Complaint  Patient presents with  . Follow-up  .  History of Present Illness: Patient returns today in follow up of PAD. He continues to have symptoms of claudication which are moderate and sometimes lifestyle limiting. He does not have any active ulcerations or infection. He has stopped smoking. ABIs today are stable to slightly decreased at 0.55 on the right and 0.71 on the left. His previous right leg bypass is chronically occluded.  Current Outpatient Prescriptions  Medication Sig Dispense Refill  . allopurinol (ZYLOPRIM) 100 MG tablet Take 100 mg by mouth daily.    Marland Kitchen aspirin 81 MG tablet Take 81 mg by mouth daily.    . carvedilol (COREG) 6.25 MG tablet Take 6.25 mg by mouth every morning.    Marland Kitchen lisinopril (PRINIVIL,ZESTRIL) 20 MG tablet Take 20 mg by mouth every morning.    . metFORMIN (GLUCOPHAGE) 500 MG tablet Take 500 mg by mouth at bedtime.    . rivaroxaban (XARELTO) 10 MG TABS tablet Take 10 mg by mouth daily.    . simvastatin (ZOCOR) 40 MG tablet Take 40 mg by mouth every morning.    Marland Kitchen spironolactone (ALDACTONE) 25 MG tablet Take 25 mg by mouth daily.    Marland Kitchen oxyCODONE-acetaminophen (PERCOCET) 7.5-325 MG per tablet Take 1 tablet by mouth every 6 (six) hours as needed for severe pain.     No current facility-administered medications for this visit.    Past Medical History:  Diagnosis Date  . Coronary artery disease   . Diabetes mellitus without complication (HCC)   . Hyperlipidemia   . Hypertension   . Myocardial infarction 1996  . Peripheral neuropathy (HCC)   . PVD (peripheral vascular disease) (HCC)    Social History        Social History  . Marital status: Married    Spouse name: N/A  . Number of children: N/A  . Years of education: N/A   Occupational History  . Not on file.        Social History Main Topics  . Smoking status: Former Smoker      Packs/day: 2.00    Years: 20.00    Quit date: 12/28/2006  . Smokeless tobacco: Not on file  . Alcohol use No  . Drug use: No  . Sexual activity: Not on file       Other Topics Concern  . Not on file      Social History Narrative  . No narrative on file        Past Surgical History:  Procedure Laterality Date  . Aortobifemoral bypass    . PERIPHERAL VASCULAR CATHETERIZATION N/A 02/01/2015   Procedure: Abdominal Aortogram w/Lower Extremity;  Surgeon: Annice Needy, MD;  Location: [facility] INVASIVE CV LAB;  Service: Cardiovascular;  Laterality: N/A;  . PERIPHERAL VASCULAR CATHETERIZATION  02/01/2015   Procedure: Lower Extremity Intervention;  Surgeon: Annice Needy, MD;  Location: [facility] INVASIVE CV LAB;  Service: Cardiovascular;;  . Right leg stents    . VASCULAR SURGERY    . WOUND DEBRIDEMENT Right 12/30/2014   Procedure: DEBRIDEMENT WOUND/RIGHT CALF I&D WITH WOUND VAC;  Surgeon: Annice Needy, MD;  Location: [facility] ORS;  Service: Vascular;  Laterality: Right;   No family history on file.  No Known Allergies    REVIEW OF SYSTEMS (Negative unless checked)  Constitutional: [] Weight loss  [] Fever  [] Chills Cardiac: [] Chest  pain   [] Chest pressure   [] Palpitations   [] Shortness of breath when laying flat   [] Shortness of breath at rest   [] Shortness of breath with exertion. Vascular:  [x] Pain in legs with walking   [] Pain in legs at rest   [] Pain in legs when laying flat   [x] Claudication   [] Pain in feet when walking  [] Pain in feet at rest  [] Pain in feet when laying flat   [] History of DVT   [] Phlebitis   [] Swelling in legs   [] Varicose veins   [] Non-healing ulcers Pulmonary:   [] Uses home oxygen   [] Productive cough   [] Hemoptysis   [] Wheeze  [] COPD   [] Asthma Neurologic:  [] Dizziness  [] Blackouts   [] Seizures   [] History of stroke   [] History of TIA  [] Aphasia   [] Temporary blindness   [] Dysphagia   [] Weakness or numbness in arms   [] Weakness or numbness in  legs Musculoskeletal:  [x] Arthritis   [] Joint swelling   [] Joint pain   [] Low back pain Hematologic:  [] Easy bruising  [] Easy bleeding   [] Hypercoagulable state   [] Anemic   Gastrointestinal:  [] Blood in stool   [] Vomiting blood  [] Gastroesophageal reflux/heartburn   [] Abdominal pain Genitourinary:  [] Chronic kidney disease   [] Difficult urination  [] Frequent urination  [] Burning with urination   [] Hematuria Skin:  [] Rashes   [] Ulcers   [] Wounds Psychological:  [] History of anxiety   []  History of major depression.  Physical Examination  BP 124/87   Pulse 87   Resp 16   Wt 199 lb (90.3 kg)   BMI 29.39 kg/m  Gen:  WD/WN, NAD Head: [place]/AT, No temporalis wasting. Ear/Nose/Throat: Hearing grossly intact, nares w/o erythema or drainage, trachea midline Eyes: Conjunctiva clear. Sclera non-icteric Neck: Supple.  No JVD.  Pulmonary:  Good air movement, no use of accessory muscles.  Cardiac: RRR, normal S1, S2 Vascular:  Vessel Right Left  Radial Palpable Palpable  Ulnar Palpable Palpable  Brachial Palpable Palpable  Carotid Palpable, without bruit Palpable, without bruit  Aorta Not palpable N/A  Femoral Palpable Palpable  Popliteal Not Palpable 1+ Palpable  PT Not Palpable 1+ Palpable  DP Not Palpable 1+ Palpable   Gastrointestinal: soft, non-tender/non-distended.  Musculoskeletal: M/S 5/5 throughout.  No deformity or atrophy. Trace right lower extremity edema. Neurologic: Sensation grossly intact in extremities.  Symmetrical.  Speech is fluent.  Psychiatric: Judgment intact, Mood & affect appropriate for pt's clinical situation. Dermatologic: No rashes or ulcers noted.  No cellulitis or open wounds. Previous right calf wound is well-healed.       Labs No results found for this or any previous visit (from the past 2160 hour(s)).  Radiology No results found.   Assessment/Plan  Hyperlipidemia lipid control important in reducing the progression of atherosclerotic disease.  Continue statin therapy   Diabetes mellitus type 2 in nonobese (HCC) blood glucose control important in reducing the progression of atherosclerotic disease. Also, involved in wound healing. On appropriate medications.   HTN (hypertension) blood pressure control important in reducing the progression of atherosclerotic disease. On appropriate oral medications.   PAD (peripheral artery disease) (HCC) ABIs today are stable to slightly decreased at 0.55 on the right and 0.71 on the left. He continues to have claudication symptoms but I do not think he has significant reasonable options for revascularization of the right leg at this point. His previous bypasses failed. At this point, he has a non-limb threatening stable situation of claudication and he and I are both pleased with  that. He will return for follow-up studies in 6 months.    Festus BarrenJason Dew, MD  01/05/2017 10:51 AM    This note was created with Dragon medical transcription system.  Any errors from dictation are purely unintentional

## 2017-01-05 NOTE — Patient Instructions (Signed)

## 2017-01-05 NOTE — Assessment & Plan Note (Signed)
ABIs today are stable to slightly decreased at 0.55 on the right and 0.71 on the left. He continues to have claudication symptoms but I do not think he has significant reasonable options for revascularization of the right leg at this point. His previous bypasses failed. At this point, he has a non-limb threatening stable situation of claudication and he and I are both pleased with that. He will return for follow-up studies in 6 months.

## 2017-01-23 ENCOUNTER — Inpatient Hospital Stay: Admit: 2017-01-23 | Payer: BLUE CROSS/BLUE SHIELD

## 2017-01-23 ENCOUNTER — Inpatient Hospital Stay: Payer: BLUE CROSS/BLUE SHIELD

## 2017-01-23 ENCOUNTER — Encounter: Payer: Self-pay | Admitting: Emergency Medicine

## 2017-01-23 ENCOUNTER — Inpatient Hospital Stay
Admission: EM | Admit: 2017-01-23 | Discharge: 2017-02-11 | DRG: 871 | Disposition: E | Payer: BLUE CROSS/BLUE SHIELD | Attending: Internal Medicine | Admitting: Internal Medicine

## 2017-01-23 ENCOUNTER — Emergency Department: Payer: BLUE CROSS/BLUE SHIELD

## 2017-01-23 ENCOUNTER — Inpatient Hospital Stay (HOSPITAL_COMMUNITY): Payer: BLUE CROSS/BLUE SHIELD

## 2017-01-23 DIAGNOSIS — R402312 Coma scale, best motor response, none, at arrival to emergency department: Secondary | ICD-10-CM | POA: Diagnosis present

## 2017-01-23 DIAGNOSIS — Z87891 Personal history of nicotine dependence: Secondary | ICD-10-CM | POA: Diagnosis not present

## 2017-01-23 DIAGNOSIS — E1142 Type 2 diabetes mellitus with diabetic polyneuropathy: Secondary | ICD-10-CM | POA: Diagnosis present

## 2017-01-23 DIAGNOSIS — J810 Acute pulmonary edema: Secondary | ICD-10-CM

## 2017-01-23 DIAGNOSIS — E872 Acidosis, unspecified: Secondary | ICD-10-CM

## 2017-01-23 DIAGNOSIS — E876 Hypokalemia: Secondary | ICD-10-CM | POA: Diagnosis present

## 2017-01-23 DIAGNOSIS — J690 Pneumonitis due to inhalation of food and vomit: Secondary | ICD-10-CM | POA: Diagnosis present

## 2017-01-23 DIAGNOSIS — I739 Peripheral vascular disease, unspecified: Secondary | ICD-10-CM | POA: Diagnosis present

## 2017-01-23 DIAGNOSIS — J9602 Acute respiratory failure with hypercapnia: Secondary | ICD-10-CM | POA: Diagnosis present

## 2017-01-23 DIAGNOSIS — G931 Anoxic brain damage, not elsewhere classified: Secondary | ICD-10-CM | POA: Diagnosis not present

## 2017-01-23 DIAGNOSIS — R570 Cardiogenic shock: Secondary | ICD-10-CM | POA: Diagnosis present

## 2017-01-23 DIAGNOSIS — Z7984 Long term (current) use of oral hypoglycemic drugs: Secondary | ICD-10-CM | POA: Diagnosis not present

## 2017-01-23 DIAGNOSIS — R34 Anuria and oliguria: Secondary | ICD-10-CM | POA: Diagnosis present

## 2017-01-23 DIAGNOSIS — I2510 Atherosclerotic heart disease of native coronary artery without angina pectoris: Secondary | ICD-10-CM | POA: Diagnosis present

## 2017-01-23 DIAGNOSIS — I4901 Ventricular fibrillation: Secondary | ICD-10-CM | POA: Diagnosis present

## 2017-01-23 DIAGNOSIS — I469 Cardiac arrest, cause unspecified: Secondary | ICD-10-CM

## 2017-01-23 DIAGNOSIS — Z8249 Family history of ischemic heart disease and other diseases of the circulatory system: Secondary | ICD-10-CM

## 2017-01-23 DIAGNOSIS — R402112 Coma scale, eyes open, never, at arrival to emergency department: Secondary | ICD-10-CM | POA: Diagnosis present

## 2017-01-23 DIAGNOSIS — I252 Old myocardial infarction: Secondary | ICD-10-CM

## 2017-01-23 DIAGNOSIS — R402212 Coma scale, best verbal response, none, at arrival to emergency department: Secondary | ICD-10-CM | POA: Diagnosis present

## 2017-01-23 DIAGNOSIS — E8729 Other acidosis: Secondary | ICD-10-CM

## 2017-01-23 DIAGNOSIS — E1151 Type 2 diabetes mellitus with diabetic peripheral angiopathy without gangrene: Secondary | ICD-10-CM | POA: Diagnosis present

## 2017-01-23 DIAGNOSIS — N179 Acute kidney failure, unspecified: Secondary | ICD-10-CM | POA: Diagnosis present

## 2017-01-23 DIAGNOSIS — I10 Essential (primary) hypertension: Secondary | ICD-10-CM | POA: Diagnosis present

## 2017-01-23 DIAGNOSIS — Z7901 Long term (current) use of anticoagulants: Secondary | ICD-10-CM

## 2017-01-23 DIAGNOSIS — Z0189 Encounter for other specified special examinations: Secondary | ICD-10-CM

## 2017-01-23 DIAGNOSIS — A419 Sepsis, unspecified organism: Secondary | ICD-10-CM | POA: Diagnosis present

## 2017-01-23 DIAGNOSIS — Z515 Encounter for palliative care: Secondary | ICD-10-CM | POA: Diagnosis not present

## 2017-01-23 DIAGNOSIS — R0600 Dyspnea, unspecified: Secondary | ICD-10-CM

## 2017-01-23 DIAGNOSIS — Z7982 Long term (current) use of aspirin: Secondary | ICD-10-CM

## 2017-01-23 DIAGNOSIS — Z452 Encounter for adjustment and management of vascular access device: Secondary | ICD-10-CM

## 2017-01-23 DIAGNOSIS — E782 Mixed hyperlipidemia: Secondary | ICD-10-CM | POA: Diagnosis present

## 2017-01-23 DIAGNOSIS — Z66 Do not resuscitate: Secondary | ICD-10-CM | POA: Diagnosis not present

## 2017-01-23 LAB — COMPREHENSIVE METABOLIC PANEL
ALBUMIN: 3.5 g/dL (ref 3.5–5.0)
ALK PHOS: 132 U/L — AB (ref 38–126)
ALT: 63 U/L (ref 17–63)
ALT: 70 U/L — ABNORMAL HIGH (ref 17–63)
ANION GAP: 14 (ref 5–15)
AST: 95 U/L — ABNORMAL HIGH (ref 15–41)
AST: 143 U/L — AB (ref 15–41)
Albumin: 3.8 g/dL (ref 3.5–5.0)
Alkaline Phosphatase: 93 U/L (ref 38–126)
Anion gap: 9 (ref 5–15)
BILIRUBIN TOTAL: 0.6 mg/dL (ref 0.3–1.2)
BUN: 13 mg/dL (ref 6–20)
BUN: 18 mg/dL (ref 6–20)
CALCIUM: 8.2 mg/dL — AB (ref 8.9–10.3)
CHLORIDE: 100 mmol/L — AB (ref 101–111)
CO2: 24 mmol/L (ref 22–32)
CO2: 24 mmol/L (ref 22–32)
CREATININE: 1.86 mg/dL — AB (ref 0.61–1.24)
Calcium: 9.3 mg/dL (ref 8.9–10.3)
Chloride: 104 mmol/L (ref 101–111)
Creatinine, Ser: 1.62 mg/dL — ABNORMAL HIGH (ref 0.61–1.24)
GFR calc Af Amer: 52 mL/min — ABNORMAL LOW (ref >60)
GFR calc Af Amer: 44 mL/min — ABNORMAL LOW (ref >60)
GFR calc non Af Amer: 45 mL/min — ABNORMAL LOW (ref >60)
GFR calc non Af Amer: 38 mL/min — ABNORMAL LOW (ref >60)
GLUCOSE: 260 mg/dL — AB (ref 65–99)
Glucose, Bld: 339 mg/dL — ABNORMAL HIGH (ref 65–99)
POTASSIUM: 3.4 mmol/L — AB (ref 3.5–5.1)
Potassium: 5.5 mmol/L — ABNORMAL HIGH (ref 3.5–5.1)
SODIUM: 138 mmol/L (ref 135–145)
Sodium: 137 mmol/L (ref 135–145)
TOTAL PROTEIN: 6.6 g/dL (ref 6.5–8.1)
Total Bilirubin: 0.6 mg/dL (ref 0.3–1.2)
Total Protein: 7 g/dL (ref 6.5–8.1)

## 2017-01-23 LAB — PROTIME-INR
INR: 1.13
INR: 1.23
PROTHROMBIN TIME: 14.6 s (ref 11.4–15.2)
Prothrombin Time: 15.6 seconds — ABNORMAL HIGH (ref 11.4–15.2)

## 2017-01-23 LAB — GLUCOSE, CAPILLARY
GLUCOSE-CAPILLARY: 289 mg/dL — AB (ref 65–99)
GLUCOSE-CAPILLARY: 249 mg/dL — AB (ref 65–99)
GLUCOSE-CAPILLARY: 264 mg/dL — AB (ref 65–99)
GLUCOSE-CAPILLARY: 178 mg/dL — AB (ref 65–99)
Glucose-Capillary: 264 mg/dL — ABNORMAL HIGH (ref 65–99)
Glucose-Capillary: 216 mg/dL — ABNORMAL HIGH (ref 65–99)
Glucose-Capillary: 182 mg/dL — ABNORMAL HIGH (ref 65–99)

## 2017-01-23 LAB — BASIC METABOLIC PANEL
Anion gap: 10 (ref 5–15)
Anion gap: 10 (ref 5–15)
Anion gap: 10 (ref 5–15)
BUN: 23 mg/dL — ABNORMAL HIGH (ref 6–20)
BUN: 25 mg/dL — ABNORMAL HIGH (ref 6–20)
BUN: 27 mg/dL — ABNORMAL HIGH (ref 6–20)
CALCIUM: 8.3 mg/dL — AB (ref 8.9–10.3)
CALCIUM: 8.1 mg/dL — AB (ref 8.9–10.3)
CO2: 23 mmol/L (ref 22–32)
CO2: 23 mmol/L (ref 22–32)
CO2: 21 mmol/L — AB (ref 22–32)
CREATININE: 2.28 mg/dL — AB (ref 0.61–1.24)
CREATININE: 2.45 mg/dL — AB (ref 0.61–1.24)
CREATININE: 2.66 mg/dL — AB (ref 0.61–1.24)
Calcium: 8 mg/dL — ABNORMAL LOW (ref 8.9–10.3)
Chloride: 106 mmol/L (ref 101–111)
Chloride: 105 mmol/L (ref 101–111)
Chloride: 106 mmol/L (ref 101–111)
GFR calc Af Amer: 28 mL/min — ABNORMAL LOW (ref >60)
GFR calc non Af Amer: 29 mL/min — ABNORMAL LOW (ref >60)
GFR calc non Af Amer: 27 mL/min — ABNORMAL LOW (ref >60)
GFR calc non Af Amer: 24 mL/min — ABNORMAL LOW (ref >60)
GFR, EST AFRICAN AMERICAN: 34 mL/min — AB (ref >60)
GFR, EST AFRICAN AMERICAN: 31 mL/min — AB (ref >60)
GLUCOSE: 163 mg/dL — AB (ref 65–99)
Glucose, Bld: 181 mg/dL — ABNORMAL HIGH (ref 65–99)
Glucose, Bld: 177 mg/dL — ABNORMAL HIGH (ref 65–99)
Potassium: 4.1 mmol/L (ref 3.5–5.1)
Potassium: 4 mmol/L (ref 3.5–5.1)
Potassium: 3.8 mmol/L (ref 3.5–5.1)
SODIUM: 139 mmol/L (ref 135–145)
Sodium: 138 mmol/L (ref 135–145)
Sodium: 137 mmol/L (ref 135–145)

## 2017-01-23 LAB — CBC WITH DIFFERENTIAL/PLATELET
Basophils Absolute: 0.1 10*3/uL (ref 0–0.1)
Basophils Relative: 1 %
EOS PCT: 2 %
Eosinophils Absolute: 0.3 10*3/uL (ref 0–0.7)
HCT: 47.9 % (ref 40.0–52.0)
Hemoglobin: 14.5 g/dL (ref 13.0–18.0)
LYMPHS ABS: 7.4 10*3/uL — AB (ref 1.0–3.6)
Lymphocytes Relative: 45 %
MCH: 25.6 pg — AB (ref 26.0–34.0)
MCHC: 30.4 g/dL — AB (ref 32.0–36.0)
MCV: 84.4 fL (ref 80.0–100.0)
MONOS PCT: 3 %
Monocytes Absolute: 0.4 10*3/uL (ref 0.2–1.0)
Neutro Abs: 8.3 10*3/uL — ABNORMAL HIGH (ref 1.4–6.5)
Neutrophils Relative %: 49 %
PLATELETS: 201 10*3/uL (ref 150–440)
RBC: 5.67 MIL/uL (ref 4.40–5.90)
RDW: 17.2 % — AB (ref 11.5–14.5)
WBC: 16.6 10*3/uL — ABNORMAL HIGH (ref 3.8–10.6)

## 2017-01-23 LAB — BLOOD GAS, ARTERIAL
ACID-BASE DEFICIT: 8.5 mmol/L — AB (ref 0.0–2.0)
Bicarbonate: 21.8 mmol/L (ref 20.0–28.0)
FIO2: 1
O2 Saturation: 89.1 %
PEEP: 20 cmH2O
Patient temperature: 36.5
RATE: 22 resp/min
VT: 550 mL
pCO2 arterial: 64 mmHg — ABNORMAL HIGH (ref 32.0–48.0)
pH, Arterial: 7.15 — CL (ref 7.350–7.450)
pO2, Arterial: 72 mmHg — ABNORMAL LOW (ref 83.0–108.0)

## 2017-01-23 LAB — CBC
HCT: 48.2 % (ref 40.0–52.0)
Hemoglobin: 14.6 g/dL (ref 13.0–18.0)
MCH: 24.7 pg — ABNORMAL LOW (ref 26.0–34.0)
MCHC: 30.3 g/dL — AB (ref 32.0–36.0)
MCV: 81.5 fL (ref 80.0–100.0)
PLATELETS: 362 10*3/uL (ref 150–440)
RBC: 5.91 MIL/uL — AB (ref 4.40–5.90)
RDW: 16.5 % — ABNORMAL HIGH (ref 11.5–14.5)
WBC: 20.5 10*3/uL — ABNORMAL HIGH (ref 3.8–10.6)

## 2017-01-23 LAB — LACTIC ACID, PLASMA
Lactic Acid, Venous: 6.6 mmol/L (ref 0.5–1.9)
Lactic Acid, Venous: 3.9 mmol/L (ref 0.5–1.9)

## 2017-01-23 LAB — APTT
APTT: 38 s — AB (ref 24–36)
aPTT: 30 seconds (ref 24–36)

## 2017-01-23 LAB — PHOSPHORUS: Phosphorus: 5.2 mg/dL — ABNORMAL HIGH (ref 2.5–4.6)

## 2017-01-23 LAB — MRSA PCR SCREENING: MRSA by PCR: NEGATIVE

## 2017-01-23 LAB — PROCALCITONIN: PROCALCITONIN: 1.62 ng/mL

## 2017-01-23 LAB — BRAIN NATRIURETIC PEPTIDE: B NATRIURETIC PEPTIDE 5: 178 pg/mL — AB (ref 0.0–100.0)

## 2017-01-23 LAB — MAGNESIUM: Magnesium: 3.7 mg/dL — ABNORMAL HIGH (ref 1.7–2.4)

## 2017-01-23 LAB — TROPONIN I: Troponin I: 0.07 ng/mL (ref <0.03)

## 2017-01-23 MED ORDER — NOREPINEPHRINE BITARTRATE 1 MG/ML IV SOLN
0.0000 ug/min | INTRAVENOUS | Status: DC
  Administered 2017-01-23: 21 ug/min via INTRAVENOUS
  Administered 2017-01-23: 27 ug/min via INTRAVENOUS
  Administered 2017-01-23: 4 ug/min via INTRAVENOUS
  Filled 2017-01-23 (×3): qty 4

## 2017-01-23 MED ORDER — ENOXAPARIN SODIUM 40 MG/0.4ML ~~LOC~~ SOLN: 40.0000 mg | SUBCUTANEOUS | Status: DC

## 2017-01-23 MED ORDER — SODIUM CHLORIDE 0.9 % IV SOLN: INTRAVENOUS | Status: DC

## 2017-01-23 MED ORDER — AMIODARONE HCL IN DEXTROSE 360-4.14 MG/200ML-% IV SOLN
60.0000 mg/h | INTRAVENOUS | Status: AC
  Administered 2017-01-23 (×2): 60 mg/h via INTRAVENOUS
  Filled 2017-01-23 (×2): qty 200

## 2017-01-23 MED ORDER — FUROSEMIDE 10 MG/ML IJ SOLN
INTRAMUSCULAR | Status: AC
  Administered 2017-01-23: 20 mg
  Filled 2017-01-23: qty 2

## 2017-01-23 MED ORDER — MAGNESIUM SULFATE 2 GM/50ML IV SOLN
2.0000 g | Freq: Once | INTRAVENOUS | Status: AC
  Administered 2017-01-23: 2 g via INTRAVENOUS
  Filled 2017-01-23: qty 50

## 2017-01-23 MED ORDER — PRO-STAT SUGAR FREE PO LIQD
30.0000 mL | Freq: Three times a day (TID) | ORAL | Status: DC
  Administered 2017-01-23 – 2017-01-24 (×2): 30 mL

## 2017-01-23 MED ORDER — POTASSIUM CHLORIDE 10 MEQ/100ML IV SOLN
10.0000 meq | INTRAVENOUS | Status: AC
  Administered 2017-01-23: 10 meq via INTRAVENOUS
  Filled 2017-01-23 (×2): qty 100

## 2017-01-23 MED ORDER — CHLORHEXIDINE GLUCONATE 0.12% ORAL RINSE (MEDLINE KIT)
15.0000 mL | Freq: Two times a day (BID) | OROMUCOSAL | Status: DC
  Administered 2017-01-23 – 2017-01-24 (×3): 15 mL via OROMUCOSAL

## 2017-01-23 MED ORDER — FENTANYL CITRATE (PF) 100 MCG/2ML IJ SOLN
100.0000 ug | Freq: Once | INTRAMUSCULAR | Status: AC
  Administered 2017-01-23: 100 ug via INTRAVENOUS
  Filled 2017-01-23: qty 2

## 2017-01-23 MED ORDER — STERILE WATER FOR INJECTION IJ SOLN
INTRAMUSCULAR | Status: AC
  Administered 2017-01-23: 10 mL
  Filled 2017-01-23: qty 10

## 2017-01-23 MED ORDER — SODIUM CHLORIDE 0.9 % IV SOLN
INTRAVENOUS | Status: DC
  Administered 2017-01-23: 1.2 [IU]/h via INTRAVENOUS
  Filled 2017-01-23: qty 1

## 2017-01-23 MED ORDER — AMIODARONE HCL IN DEXTROSE 360-4.14 MG/200ML-% IV SOLN
30.0000 mg/h | INTRAVENOUS | Status: DC
  Administered 2017-01-23 – 2017-01-25 (×5): 30 mg/h via INTRAVENOUS
  Filled 2017-01-23 (×4): qty 200

## 2017-01-23 MED ORDER — IPRATROPIUM-ALBUTEROL 0.5-2.5 (3) MG/3ML IN SOLN
RESPIRATORY_TRACT | Status: AC
  Administered 2017-01-23: 6 mL via RESPIRATORY_TRACT
  Filled 2017-01-23: qty 6

## 2017-01-23 MED ORDER — VECURONIUM BROMIDE 10 MG IV SOLR
INTRAVENOUS | Status: AC
  Administered 2017-01-23: 10 mg
  Filled 2017-01-23: qty 10

## 2017-01-23 MED ORDER — FENTANYL BOLUS VIA INFUSION
50.0000 ug | INTRAVENOUS | Status: DC | PRN
  Filled 2017-01-23: qty 50

## 2017-01-23 MED ORDER — FENTANYL CITRATE (PF) 100 MCG/2ML IJ SOLN
50.0000 ug | Freq: Once | INTRAMUSCULAR | Status: AC
  Administered 2017-01-23: 50 ug via INTRAVENOUS
  Filled 2017-01-23: qty 2

## 2017-01-23 MED ORDER — MIDAZOLAM BOLUS VIA INFUSION
2.0000 mg | INTRAVENOUS | Status: DC | PRN
  Administered 2017-01-25: 2 mg via INTRAVENOUS
  Filled 2017-01-23: qty 2

## 2017-01-23 MED ORDER — NOREPINEPHRINE BITARTRATE 1 MG/ML IV SOLN
0.0000 ug/min | INTRAVENOUS | Status: DC
  Administered 2017-01-24: 20 ug/min via INTRAVENOUS
  Administered 2017-01-24: 25 ug/min via INTRAVENOUS
  Administered 2017-01-25: 33 ug/min via INTRAVENOUS
  Filled 2017-01-23 (×4): qty 16

## 2017-01-23 MED ORDER — MIDAZOLAM HCL 5 MG/ML IJ SOLN
2.0000 mg/h | INTRAMUSCULAR | Status: DC
  Administered 2017-01-23 – 2017-01-24 (×2): 2 mg/h via INTRAVENOUS
  Filled 2017-01-23 (×3): qty 10

## 2017-01-23 MED ORDER — FUROSEMIDE 10 MG/ML IJ SOLN: 40.0000 mg | Freq: Three times a day (TID) | INTRAMUSCULAR | Status: DC

## 2017-01-23 MED ORDER — ALBUTEROL SULFATE (2.5 MG/3ML) 0.083% IN NEBU
2.5000 mg | INHALATION_SOLUTION | RESPIRATORY_TRACT | Status: DC
  Administered 2017-01-23: 2.5 mg via RESPIRATORY_TRACT
  Filled 2017-01-23: qty 3

## 2017-01-23 MED ORDER — VANCOMYCIN HCL IN DEXTROSE 1-5 GM/200ML-% IV SOLN
1000.0000 mg | Freq: Once | INTRAVENOUS | Status: AC
  Administered 2017-01-23: 1000 mg via INTRAVENOUS
  Filled 2017-01-23: qty 200

## 2017-01-23 MED ORDER — ALBUTEROL SULFATE (2.5 MG/3ML) 0.083% IN NEBU
2.5000 mg | INHALATION_SOLUTION | Freq: Four times a day (QID) | RESPIRATORY_TRACT | Status: DC
  Administered 2017-01-23 – 2017-01-25 (×9): 2.5 mg via RESPIRATORY_TRACT
  Filled 2017-01-23 (×8): qty 3

## 2017-01-23 MED ORDER — ONDANSETRON HCL 4 MG PO TABS: 4.0000 mg | ORAL_TABLET | Freq: Four times a day (QID) | ORAL | Status: DC | PRN

## 2017-01-23 MED ORDER — PANTOPRAZOLE SODIUM 40 MG IV SOLR
40.0000 mg | Freq: Every day | INTRAVENOUS | Status: DC
  Administered 2017-01-23 – 2017-01-24 (×2): 40 mg via INTRAVENOUS
  Filled 2017-01-23 (×2): qty 40

## 2017-01-23 MED ORDER — VITAL HIGH PROTEIN PO LIQD
1000.0000 mL | ORAL | Status: DC
  Administered 2017-01-23: 1000 mL
  Administered 2017-01-23: 17:00:00

## 2017-01-23 MED ORDER — SODIUM CHLORIDE 0.9 % IV SOLN
1250.0000 mg | INTRAVENOUS | Status: DC
  Administered 2017-01-23 – 2017-01-24 (×2): 1250 mg via INTRAVENOUS
  Filled 2017-01-23 (×3): qty 1250

## 2017-01-23 MED ORDER — SODIUM CHLORIDE 0.9 % IV BOLUS (SEPSIS): 1000.0000 mL | Freq: Once | INTRAVENOUS | Status: DC

## 2017-01-23 MED ORDER — MIDAZOLAM HCL 2 MG/2ML IJ SOLN
2.0000 mg | INTRAMUSCULAR | Status: AC
  Administered 2017-01-23: 2 mg via INTRAVENOUS
  Filled 2017-01-23: qty 2

## 2017-01-23 MED ORDER — FENTANYL 2500MCG IN NS 250ML (10MCG/ML) PREMIX INFUSION
100.0000 ug/h | INTRAVENOUS | Status: DC
  Administered 2017-01-23: 50 ug/h via INTRAVENOUS
  Administered 2017-01-25: 100 ug/h via INTRAVENOUS
  Filled 2017-01-23 (×2): qty 250

## 2017-01-23 MED ORDER — PIPERACILLIN-TAZOBACTAM 3.375 G IVPB 30 MIN
3.3750 g | Freq: Once | INTRAVENOUS | Status: DC
Start: 2017-01-23 — End: 2017-01-23

## 2017-01-23 MED ORDER — SODIUM CHLORIDE 0.9 % IV SOLN
INTRAVENOUS | Status: DC
  Administered 2017-01-23: 0.9 [IU]/h via INTRAVENOUS
  Filled 2017-01-23: qty 1

## 2017-01-23 MED ORDER — PIPERACILLIN-TAZOBACTAM 3.375 G IVPB
3.3750 g | Freq: Three times a day (TID) | INTRAVENOUS | Status: DC
  Administered 2017-01-23 – 2017-01-25 (×7): 3.375 g via INTRAVENOUS
  Filled 2017-01-23 (×9): qty 50

## 2017-01-23 MED ORDER — SODIUM CHLORIDE 0.9 % IV BOLUS (SEPSIS)
1000.0000 mL | Freq: Once | INTRAVENOUS | Status: AC
  Administered 2017-01-23: 1000 mL via INTRAVENOUS

## 2017-01-23 MED ORDER — CISATRACURIUM BOLUS VIA INFUSION
0.0500 mg/kg | INTRAVENOUS | Status: DC | PRN
  Filled 2017-01-23: qty 5

## 2017-01-23 MED ORDER — ONDANSETRON HCL 4 MG/2ML IJ SOLN: 4.0000 mg | Freq: Four times a day (QID) | INTRAMUSCULAR | Status: DC | PRN

## 2017-01-23 MED ORDER — HEPARIN SODIUM (PORCINE) 5000 UNIT/ML IJ SOLN
5000.0000 [IU] | Freq: Three times a day (TID) | INTRAMUSCULAR | Status: DC
  Administered 2017-01-23 – 2017-01-25 (×7): 5000 [IU] via SUBCUTANEOUS
  Filled 2017-01-23 (×7): qty 1

## 2017-01-23 MED ORDER — IPRATROPIUM-ALBUTEROL 0.5-2.5 (3) MG/3ML IN SOLN
6.0000 mL | Freq: Once | RESPIRATORY_TRACT | Status: AC
  Administered 2017-01-23: 6 mL via RESPIRATORY_TRACT

## 2017-01-23 MED ORDER — POLYVINYL ALCOHOL 1.4 % OP SOLN
1.0000 [drp] | Freq: Three times a day (TID) | OPHTHALMIC | Status: DC
  Administered 2017-01-23 – 2017-01-25 (×7): 1 [drp] via OPHTHALMIC
  Filled 2017-01-23: qty 15

## 2017-01-23 MED ORDER — FUROSEMIDE 10 MG/ML IJ SOLN
INTRAMUSCULAR | Status: AC
  Administered 2017-01-23: 40 mg
  Filled 2017-01-23: qty 4

## 2017-01-23 MED ORDER — ACETAMINOPHEN 650 MG RE SUPP: 650.0000 mg | Freq: Four times a day (QID) | RECTAL | Status: DC | PRN

## 2017-01-23 MED ORDER — CISATRACURIUM BOLUS VIA INFUSION
0.1000 mg/kg | Freq: Once | INTRAVENOUS | Status: DC
  Filled 2017-01-23: qty 10

## 2017-01-23 MED ORDER — SODIUM CHLORIDE 0.9 % IV SOLN: 100.0000 ug/h | INTRAVENOUS | Status: DC

## 2017-01-23 MED ORDER — MIDAZOLAM HCL 2 MG/2ML IJ SOLN: 2.0000 mg | Freq: Once | INTRAMUSCULAR | Status: DC

## 2017-01-23 MED ORDER — SODIUM CHLORIDE 0.9% FLUSH
10.0000 mL | INTRAVENOUS | Status: DC | PRN
Start: 2017-01-23 — End: 2017-01-25

## 2017-01-23 MED ORDER — SODIUM CHLORIDE 0.9 % IV SOLN
1.0000 ug/kg/min | INTRAVENOUS | Status: DC
  Filled 2017-01-23: qty 20

## 2017-01-23 MED ORDER — SODIUM BICARBONATE 8.4 % IV SOLN
50.0000 meq | Freq: Once | INTRAVENOUS | Status: AC
  Administered 2017-01-23: 50 meq via INTRAVENOUS

## 2017-01-23 MED ORDER — ACETAMINOPHEN 325 MG PO TABS: 650.0000 mg | ORAL_TABLET | Freq: Four times a day (QID) | ORAL | Status: DC | PRN

## 2017-01-23 MED ORDER — AMIODARONE LOAD VIA INFUSION
150.0000 mg | Freq: Once | INTRAVENOUS | Status: AC
  Administered 2017-01-23: 150 mg via INTRAVENOUS
  Filled 2017-01-23: qty 83.34

## 2017-01-23 MED ORDER — SODIUM CHLORIDE 0.9 % IJ SOLN
INTRAMUSCULAR | Status: AC
  Administered 2017-01-23: 13:00:00
  Filled 2017-01-23: qty 10

## 2017-01-23 MED ORDER — ORAL CARE MOUTH RINSE
15.0000 mL | Freq: Four times a day (QID) | OROMUCOSAL | Status: DC
  Administered 2017-01-23 – 2017-01-24 (×3): 15 mL via OROMUCOSAL

## 2017-01-23 MED ORDER — ARTIFICIAL TEARS OPHTHALMIC OINT: 1.0000 "application " | TOPICAL_OINTMENT | Freq: Three times a day (TID) | OPHTHALMIC | Status: DC

## 2017-01-23 MED ORDER — ASPIRIN 300 MG RE SUPP
300.0000 mg | RECTAL | Status: AC
  Administered 2017-01-23: 300 mg via RECTAL
  Filled 2017-01-23: qty 1

## 2017-01-23 NOTE — Significant Event (Signed)
Upon arrival to the ICU pt went into a PEA arrest; therefore ACLS protocol initiated pt received 2 Epi, 1 amp Sodium Bicarb, 2 grams Magnesium with ROSC within 5 minutes of interventions cardiac rhythm post cardiac arrest sinus tach with wide QRS complex pts family updated regarding pt status and all questions answered.  Pts wife stated she did not want the [facility] notified at this time.   Sonda Rumbleana Blakeney, AGNP  Pulmonary/Critical Care Pager [phone] (please enter 7 digits) PCCM Consult Pager [phone] (please enter 7 digits)  STAFF NOTE: I. Dr. Nicholos Johnsamachandran, have personally reviewed the patient's available data including medical history , events of notes, physican examination and test results as part of my evaluation. I have discussed with the  Care with the NP and other care providers including  pharmacist, ICU RN, RRT, dietary.  Physical Exam  The patient was coded as above, after administering epinephrine, [facility]. We'll start the patient on amiodarone infusion. The patient appears to have diffuse rhonchi, likely consistent with acute pulmonary edema. We'll start the patient on Lasix IV. Prognosis is guarded. Lungs - diffuse rhonchi.   Critical Care Attestation.  I have personally obtained a history, examined the patient, evaluated laboratory and imaging results, formulated the assessment and plan and placed orders. The Patient requires high complexity decision making for assessment and support, frequent evaluation and titration of therapies, application of advanced monitoring technologies and extensive interpretation of multiple databases. The patient has critical illness that could lead imminently to failure of 1 or more organ systems and requires the highest level of physician preparedness to intervene.  Critical Care Time devoted to patient care services described in this note is 30 minutes and is exclusive of time spent in procedures supervisory time of NP.

## 2017-01-23 NOTE — Progress Notes (Addendum)
Wilfred LacyPaige Crater at [facility] notified of patient GCS < 5  Ref # [id number]  Harlene SaltsDavid Johnson is [facility]DC.

## 2017-01-23 NOTE — Progress Notes (Signed)
Initial Nutrition Assessment  DOCUMENTATION CODES:   Obesity unspecified  INTERVENTION:  1. Received verbal consult to begin tubefeeding from MD Nicholos Johnsamachandran, Recommend provide Vital High Protein via OGT @ 5850mL/hr, Pro-Stat 30mL TID Provides 1500 calories, 150 grams protein, 1003cc free water  NUTRITION DIAGNOSIS:   Inadequate oral intake related to inability to eat as evidenced by NPO status.  GOAL:   Provide needs based on ASPEN/SCCM guidelines  MONITOR:   Skin, TF tolerance, I & O's, Labs, Vent status  REASON FOR ASSESSMENT:   Ventilator    ASSESSMENT:   61 year old male patient with history of coronary artery disease, diabetes mellitus and peripheral vascular disease, hyperlipidemia, hypertension was brought to the emergency room status post cardiac arrest after resuscitation.  Patient is currently intubated on ventilator support MV: 11.8 L/min Temp (24hrs), Avg:93.8 F (34.3 C), Min:85.4 F (29.7 C), Max:97 F (36.1 C) Propofol: none Labs and medications reviewed: CBGs 249-269, K 5.5, BUN/Creatinine 18/1.86, Phos 5.2, Mg 3.7, K 5.5, LFTs elevated Amiodarone gtt, Nimbex gtt, Fentanyl gtt, Versed gtt, Levo gtt  Diet Order:  Diet NPO time specified  Skin:  Reviewed, no issues  Last BM:  PTA  Height:   Ht Readings from Last 1 Encounters:  02/04/2017 5\' 8"  (1.727 m)    Weight:   Wt Readings from Last 1 Encounters:  01/19/2017 209 lb 11.2 oz (95.1 kg)    Ideal Body Weight:  70 kg  BMI:  Body mass index is 31.88 kg/m.  Estimated Nutritional Needs:   Kcal:  1046 - 1332 calories  Protein:  >/= 140 grams  Fluid:  Per MD/NP/PA  EDUCATION NEEDS:   Education needs no appropriate at this time  Wayne AnoWilliam M. Jacky Hartung, MS, RD LDN Inpatient Clinical Dietitian Pager [phone]

## 2017-01-23 NOTE — ED Notes (Signed)
1 amp of sodium bicarb given by RN Raquel.

## 2017-01-23 NOTE — Consult Note (Signed)
Reason for Consult: cardiac arrest  Referring Physician: Dr. Luberta Mutter   CC: cardiac arrest   HPI: Wayne [name] is an 61 y.o. male with a known history of Coronary artery disease, diabetes mellitus, hyperlipidemia, hypertension, peripheral neuropathy, peripheral vascular disease was found by wife unresponsive early this morning. Patient was found unresponsive and EMS was called. Full ACLS protocol was ran patient was given 5 rounds of epinephrine, 3 amiodarone and patient was found to be in ventricular fibrillation and was shocked 3 times en route to the hospital. Currently intubated, sedated with fentanyl, versed.  Initial CTH no acute abnormalities.   Past Medical History:  Diagnosis Date  . Coronary artery disease   . Diabetes mellitus without complication (HCC)   . Hyperlipidemia   . Hypertension   . Myocardial infarction (HCC) 1996  . Peripheral neuropathy   . PVD (peripheral vascular disease) (HCC)     Past Surgical History:  Procedure Laterality Date  . Aortobifemoral bypass    . PERIPHERAL VASCULAR CATHETERIZATION N/A 02/01/2015   Procedure: Abdominal Aortogram w/Lower Extremity;  Surgeon: Annice Needy, MD;  Location: [facility] INVASIVE CV LAB;  Service: Cardiovascular;  Laterality: N/A;  . PERIPHERAL VASCULAR CATHETERIZATION  02/01/2015   Procedure: Lower Extremity Intervention;  Surgeon: Annice Needy, MD;  Location: [facility] INV[facility] CV [facility];  Service: Cardiovascular;;  . Right leg stents    . VASCULAR SURGERY    . WOUND DEBRIDEMENT Right 12/30/2014   Procedure: DEBRIDEMENT WOUND/RIGHT CALF I&D WITH WOUND VAC;  Surgeon: Annice Needy, MD;  Location: [facility] ORS;  Service: Vascular;  Laterality: Right;    Family History  Problem Relation Age of Onset  . Hypertension Mother   . Hypertension Father   . Hypertension Brother     Social History:  reports that he quit smoking about 10 years ago. He has a 40.00 pack-year smoking history. He has never used smokeless tobacco. He reports that he does  not drink alcohol or use drugs.  No Known Allergies  Medications: I have reviewed the patient's current medications.  ROS: Unable to obtain   Physical Examination: Blood pressure (!) 87/75, pulse 99, temperature (!) 95.4 F (35.2 C), temperature source Core (Comment), resp. rate 20, height 5\' 7"  (1.702 m), weight 100.5 kg (221 lb 9 oz), SpO2 95 %.  No brainstem reflexes at this time, but pt is on sedation No corneals, no pupils No cough/gag Pupils 1mm not reactive.    Laboratory Studies:   Basic Metabolic Panel:  Recent Labs Lab 01/22/2017 0511 01/24/2017 0914  NA 138 137  K 3.4* 5.5*  CL 100* 104  CO2 24 24  GLUCOSE 339* 260*  BUN 13 18  CREATININE 1.62* 1.86*  CALCIUM 9.3 8.2*  MG  --  3.7*  PHOS  --  5.2*    Liver Function Tests:  Recent Labs Lab 01/30/2017 0511 01/28/2017 0914  AST 95* 143*  ALT 63 70*  ALKPHOS 93 132*  BILITOT 0.6 0.6  PROT 7.0 6.6  ALBUMIN 3.8 3.5   No results for input(s): LIPASE, AMYLASE in the last 168 hours. No results for input(s): AMMONIA in the last 168 hours.  CBC:  Recent Labs Lab 02/08/2017 0511 01/30/2017 0914  WBC 16.6* 20.5*  NEUTROABS 8.3*  --   HGB 14.5 14.6  HCT 47.9 48.2  MCV 84.4 81.5  PLT 201 362    Cardiac Enzymes:  Recent Labs Lab 02/07/2017 0511  TROPONINI 0.07*    BNP: Invalid input(s): POCBNP  CBG:  Recent Labs Lab 01/17/2017 0710 01/22/2017 0827 01/31/2017 1004 01/14/2017 1107  GLUCAP 289* 249* 264* 264*    Microbiology: Results for orders placed or performed during the hospital encounter of 02/08/2017  Blood culture (routine x 2)     Status: None (Preliminary result)   Collection Time: 01/31/2017  5:10 AM  Result Value Ref Range Status   Specimen Description BLOOD LAC  Final   Special Requests   Final    BOTTLES DRAWN AEROBIC AND ANAEROBIC Blood Culture adequate volume   Culture NO GROWTH < 12 HOURS  Final   Report Status PENDING  Incomplete  Blood culture (routine x 2)     Status: None  (Preliminary result)   Collection Time: 01/17/2017  5:22 AM  Result Value Ref Range Status   Specimen Description BLOOD RAC  Final   Special Requests   Final    BOTTLES DRAWN AEROBIC AND ANAEROBIC Blood Culture adequate volume   Culture NO GROWTH < 12 HOURS  Final   Report Status PENDING  Incomplete    Coagulation Studies:  Recent Labs  02/08/2017 0914  LABPROT 14.6  INR 1.13    Urinalysis: No results for input(s): COLORURINE, LABSPEC, PHURINE, GLUCOSEU, HGBUR, BILIRUBINUR, KETONESUR, PROTEINUR, UROBILINOGEN, NITRITE, LEUKOCYTESUR in the last 168 hours.  Invalid input(s): APPERANCEUR  Lipid Panel:  No results found for: CHOL, TRIG, HDL, CHOLHDL, VLDL, LDLCALC  HgbA1C:  Lab Results  Component Value Date   HGBA1C 5.6 12/05/2014    Urine Drug Screen:  No results found for: LABOPIA, COCAINSCRNUR, LABBENZ, AMPHETMU, THCU, LABBARB  Alcohol Level: No results for input(s): ETH in the last 168 hours.  Other results: EKG: normal EKG, normal sinus rhythm, unchanged from previous tracings.  Imaging: Ct Head Wo Contrast  Result Date: 01/24/2017 CLINICAL DATA:  Status post resuscitation. Patient found pulseless. Initial encounter. EXAM: CT HEAD WITHOUT CONTRAST TECHNIQUE: Contiguous axial images were obtained from the base of the skull through the vertex without intravenous contrast. COMPARISON:  None. FINDINGS: Brain: No evidence of acute infarction, hemorrhage, hydrocephalus, extra-axial collection or mass lesion/mass effect. Small chronic lacunar infarcts are seen at the basal ganglia bilaterally. The posterior fossa, including the cerebellum, brainstem and fourth ventricle, is within normal limits. The third and lateral ventricles are unremarkable in appearance. The cerebral hemispheres are symmetric in appearance, with normal gray-white differentiation. No mass effect or midline shift is seen. Vascular: No hyperdense vessel or unexpected calcification. Skull: There is no evidence of  fracture; visualized osseous structures are unremarkable in appearance. Sinuses/Orbits: The visualized portions of the orbits are within normal limits. There is partial opacification of the sphenoid sinus with fluid. The remaining paranasal sinuses and mastoid air cells are well-aerated. Other: No significant soft tissue abnormalities are seen. IMPRESSION: 1. No acute intracranial pathology seen on CT. 2. 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 3. Partial opacification of the sphenoid sinus. Electronically Signed   By: Roanna Raider M.D.   On: 01/21/2017 06:29   Dg Chest Port 1 View  Result Date: 01/15/2017 CLINICAL DATA:  Central line placement, recent cardiac arrest, intubated patient. EXAM: PORTABLE CHEST 1 VIEW COMPARISON:  Portable chest x-ray of January 23, 2017 at 5:11 a.m. FINDINGS: The patient has undergone placement of a left internal jugular venous catheter. The tip projects over the midportion of the SVC. There is no postprocedure pneumothorax. The interstitial markings remain increased bilaterally greatest in the upper lobes where area of confluent density on the right has developed. The cardiac silhouette  remains enlarged. The pulmonary vascularity is engorged and indistinct. External pacemaker defibrillator pads are present. The endotracheal tube tip lies approximately 8.8 cm above the carina. The tip is at this. Our margin of the clavicular heads. IMPRESSION: No postprocedure complication following left internal jugular venous catheter placement. High positioning of the endotracheal tube. Advancement by 4 cm is recommended. CHF with mild interstitial edema. Developing alveolar opacity in the right upper lobe may reflect pulmonary edema or pneumonia. Electronically Signed   By: David  SwazilandJordan M.D.   On: 02/04/2017 08:19   Dg Chest Portable 1 View  Result Date: 01/22/2017 CLINICAL DATA:  Endotracheal tube placement. Status post resuscitation. Initial encounter. EXAM:  PORTABLE CHEST 1 VIEW COMPARISON:  None. FINDINGS: The patient's endotracheal tube is seen ending 10 cm above the carina. This could be advanced 6 cm. Vascular congestion is noted. Increased interstitial markings raise concern for mild pulmonary edema. No pleural effusion or pneumothorax is seen. The cardiomediastinal silhouette is enlarged. External pacing pads are noted. No acute osseous abnormalities are seen. IMPRESSION: 1. Endotracheal tube seen ending 10 cm above the carina. This could be advanced 6 cm, as deemed clinically appropriate. 2. Vascular congestion and cardiomegaly. Increased interstitial markings raise concern for mild pulmonary edema. Electronically Signed   By: Roanna RaiderJeffery  Chang M.D.   On: 01/21/2017 05:36     Assessment/Plan:  61 y.o. male with a known history of Coronary artery disease, diabetes mellitus, hyperlipidemia, hypertension, peripheral neuropathy, peripheral vascular disease was found by wife unresponsive early this morning. Patient was found unresponsive and EMS was called. Full ACLS protocol was ran patient was given 5 rounds of epinephrine, 3 amiodarone and patient was found to be in ventricular fibrillation and was shocked 3 times en route to the hospital. Currently intubated, sedated with fentanyl, versed.  Initial CTH no acute abnormalities.   - EEG - On hypothermia protocol currently at 35 degrees being cooled to 33 degrees - Further examinations when warmed and possibly off sedation - will need more imaging in few days.  02/10/2017, 1:08 PM

## 2017-01-23 NOTE — H&P (Signed)
[facility] [place] at [facility]   PATIENT NAME: Wayne [name]    MR#:  [id number]  DATE OF BIRTH:  05-04-1956  DATE OF ADMISSION:  02/09/2017  PRIMARY CARE PHYSICIAN: Marisue Ivan, MD   REQUESTING/REFERRING PHYSICIAN:   CHIEF COMPLAINT:   Chief Complaint  Patient presents with  . CPR    HISTORY OF PRESENT ILLNESS: Wayne [name]  is a 61 y.o. male with a known history of Coronary artery disease, diabetes mellitus, hyperlipidemia, hypertension, peripheral neuropathy, peripheral vascular disease was found by wife unresponsive early this morning. Patient was found unresponsive and EMS was called. Full ACLS protocol was ran patient was given 5 rounds of epinephrine, 3 amiodarone and patient was found to be in ventricular fibrillation and was shocked 3 times en route to the hospital. Patient was intubated and put on ventilator in the emergency room. He was worked up in the emergency room CT head showed no acute intracranial abnormality. His lactate level was elevated at 6.6. ABG was reviewed. Shows 7.06. Patient received IV bicarbonate in the emergency room. Hospitalist service was consulted for further care of the patient. Discussed with intensivist on-call patient will be admitted to the ICU and further management by intensivist team. Patient will be started on broad-spectrum IV antibiotics and IV fluids based on sepsis protocol. Cardiac troponin is 0.07 will cycle some more levels and cardiology consultation. Hypothermia protocol.  PAST MEDICAL HISTORY:   Past Medical History:  Diagnosis Date  . Coronary artery disease   . Diabetes mellitus without complication (HCC)   . Hyperlipidemia   . Hypertension   . Myocardial infarction (HCC) 1996  . Peripheral neuropathy   . PVD (peripheral vascular disease) (HCC)     PAST SURGICAL HISTORY: Past Surgical History:  Procedure Laterality Date  . Aortobifemoral bypass    . PERIPHERAL VASCULAR CATHETERIZATION N/A  02/01/2015   Procedure: Abdominal Aortogram w/Lower Extremity;  Surgeon: Annice Needy, MD;  Location: [facility] INVASIVE CV LAB;  Service: Cardiovascular;  Laterality: N/A;  . PERIPHERAL VASCULAR CATHETERIZATION  02/01/2015   Procedure: Lower Extremity Intervention;  Surgeon: Annice Needy, MD;  Location: [facility] INVASIVE CV LAB;  Service: Cardiovascular;;  . Right leg stents    . VASCULAR SURGERY    . WOUND DEBRIDEMENT Right 12/30/2014   Procedure: DEBRIDEMENT WOUND/RIGHT CALF I&D WITH WOUND VAC;  Surgeon: Annice Needy, MD;  Location: [facility] ORS;  Service: Vascular;  Laterality: Right;    SOCIAL HISTORY:  Social History  Substance Use Topics  . Smoking status: Former Smoker    Packs/day: 2.00    Years: 20.00    Quit date: 12/28/2006  . Smokeless tobacco: Never Used  . Alcohol use No    FAMILY HISTORY:  Family History  Problem Relation Age of Onset  . Hypertension Mother   . Hypertension Father   . Hypertension Brother     DRUG ALLERGIES: No Known Allergies  REVIEW OF SYSTEMS:  Could not be obtained secondary to cardiac arrest MEDICATIONS AT HOME:  Prior to Admission medications   Medication Sig Start Date End Date Taking? Authorizing Provider  allopurinol (ZYLOPRIM) 100 MG tablet Take 100 mg by mouth daily.    [provider]  aspirin 81 MG tablet Take 81 mg by mouth daily.    [provider]  carvedilol (COREG) 6.25 MG tablet Take 6.25 mg by mouth every morning.    [provider]  lisinopril (PRINIVIL,ZESTRIL) 20 MG tablet Take 20 mg by mouth every  morning.    [provider]  metFORMIN (GLUCOPHAGE) 500 MG tablet Take 500 mg by mouth at bedtime.    [provider]  oxyCODONE-acetaminophen (PERCOCET) 7.5-325 MG per tablet Take 1 tablet by mouth every 6 (six) hours as needed for severe pain.    [provider]  rivaroxaban (XARELTO) 10 MG TABS tablet Take 10 mg by mouth daily.    [provider]  simvastatin (ZOCOR) 40 MG tablet  Take 40 mg by mouth every morning.    [provider]  spironolactone (ALDACTONE) 25 MG tablet Take 25 mg by mouth daily.    [provider]      PHYSICAL EXAMINATION:   VITAL SIGNS: Blood pressure (!) 114/92, pulse 86, temperature 97 F (36.1 C), resp. rate (!) 21, weight 95.1 kg (209 lb 11.2 oz), SpO2 94 %.  GENERAL:  61 y.o.-year-old patient lying in the bed on ventilator Vent setting : tidal volume 525 Rate 18 Sat 100 % PEEP :5 EYES: Pupils dilated at 3mm,. No scleral icterus. Extraocular muscles intact.  HEENT: Head atraumatic, normocephalic. Oropharynx and nasopharynx clear.  ET tube noted NECK:  Supple, no jugular venous distention. No thyroid enlargement, no tenderness.  LUNGS: Normal breath sounds bilaterally, no wheezing, rales,rhonchi or crepitation. No use of accessory muscles of respiration.  CARDIOVASCULAR: S1, S2 normal. No murmurs, rubs, or gallops.  ABDOMEN: Soft, nontender, nondistended. Bowel sounds present. No organomegaly or mass.  EXTREMITIES: No pedal edema, cyanosis, or clubbing.  NEUROLOGIC: Not oriented to time, place and person On ventilator. PSYCHIATRIC: could not be assesed SKIN: No obvious rash, lesion, or ulcer.   LABORATORY PANEL:   CBC  Recent Labs Lab 01-27-2017 0511  WBC 16.6*  HGB 14.5  HCT 47.9  PLT 201  MCV 84.4  MCH 25.6*  MCHC 30.4*  RDW 17.2*  LYMPHSABS 7.4*  MONOABS 0.4  EOSABS 0.3  BASOSABS 0.1   ------------------------------------------------------------------------------------------------------------------  Chemistries   Recent Labs Lab 01-27-2017 0511  NA 138  K 3.4*  CL 100*  CO2 24  GLUCOSE 339*  BUN 13  CREATININE 1.62*  CALCIUM 9.3  AST 95*  ALT 63  ALKPHOS 93  BILITOT 0.6   ------------------------------------------------------------------------------------------------------------------ CrCl cannot be calculated (Unknown ideal  weight.). ------------------------------------------------------------------------------------------------------------------ No results for input(s): TSH, T4TOTAL, T3FREE, THYROIDAB in the last 72 hours.  Invalid input(s): FREET3   Coagulation profile No results for input(s): INR, PROTIME in the last 168 hours. ------------------------------------------------------------------------------------------------------------------- No results for input(s): DDIMER in the last 72 hours. -------------------------------------------------------------------------------------------------------------------  Cardiac Enzymes  Recent Labs Lab 01-27-2017 0511  TROPONINI 0.07*   ------------------------------------------------------------------------------------------------------------------ Invalid input(s): POCBNP  ---------------------------------------------------------------------------------------------------------------  Urinalysis No results found for: COLORURINE, APPEARANCEUR, LABSPEC, PHURINE, GLUCOSEU, HGBUR, BILIRUBINUR, KETONESUR, PROTEINUR, UROBILINOGEN, NITRITE, LEUKOCYTESUR   RADIOLOGY: Ct Head Wo Contrast  Result Date: 2017-08-13 CLINICAL DATA:  Status post resuscitation. Patient found pulseless. Initial encounter. EXAM: CT HEAD WITHOUT CONTRAST TECHNIQUE: Contiguous axial images were obtained from the base of the skull through the vertex without intravenous contrast. COMPARISON:  None. FINDINGS: Brain: No evidence of acute infarction, hemorrhage, hydrocephalus, extra-axial collection or mass lesion/mass effect. Small chronic lacunar infarcts are seen at the basal ganglia bilaterally. The posterior fossa, including the cerebellum, brainstem and fourth ventricle, is within normal limits. The third and lateral ventricles are unremarkable in appearance. The cerebral hemispheres are symmetric in appearance, with normal gray-white differentiation. No mass effect or midline shift is seen.  Vascular: No hyperdense vessel or unexpected calcification. Skull: There is no evidence of fracture;  visualized osseous structures are unremarkable in appearance. Sinuses/Orbits: The visualized portions of the orbits are within normal limits. There is partial opacification of the sphenoid sinus with fluid. The remaining paranasal sinuses and mastoid air cells are well-aerated. Other: No significant soft tissue abnormalities are seen. IMPRESSION: 1. No acute intracranial pathology seen on CT. 2. 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 3. Partial opacification of the sphenoid sinus. Electronically Signed   By: Roanna Raider M.D.   On: 2017/02/10 06:29   Dg Chest Portable 1 View  Result Date: February 10, 2017 CLINICAL DATA:  Endotracheal tube placement. Status post resuscitation. Initial encounter. EXAM: PORTABLE CHEST 1 VIEW COMPARISON:  None. FINDINGS: The patient's endotracheal tube is seen ending 10 cm above the carina. This could be advanced 6 cm. Vascular congestion is noted. Increased interstitial markings raise concern for mild pulmonary edema. No pleural effusion or pneumothorax is seen. The cardiomediastinal silhouette is enlarged. External pacing pads are noted. No acute osseous abnormalities are seen. IMPRESSION: 1. Endotracheal tube seen ending 10 cm above the carina. This could be advanced 6 cm, as deemed clinically appropriate. 2. Vascular congestion and cardiomegaly. Increased interstitial markings raise concern for mild pulmonary edema. Electronically Signed   By: Roanna Raider M.D.   On: 02-10-2017 05:36    EKG: Orders placed or performed during the hospital encounter of 02-10-2017  . ED EKG  . ED EKG  . EKG 12-Lead  . EKG 12-Lead  . EKG 12-Lead  . EKG 12-Lead    IMPRESSION AND PLAN: 61 year old male patient with history of coronary artery disease, diabetes mellitus and peripheral vascular disease, hyperlipidemia, hypertension was brought to the emergency room  status post cardiac arrest after resuscitation. Admitting diagnosis 1. Cardiac arrest 2. Cardiorespiratory failure 3. Sepsis 4. Metabolic acidosis 5. Encephalopathy 6. Ventricular arrhythmia Treatment plan Admit patient to ICU Continue mechanical ventilation IV fluid hydration Start patient on IV vancomycin and IV Zosyn antibiotics IV sodium bicarbonate given in the emergency room Cardiology consultation Intensivist consultation Hypothermia protocol Follow-up cultures and troponin Check echocardiogram DVT prophylaxis subcutaneous Lovenox 40 MG daily  All the records are reviewed and case discussed with ED provider. Management plans discussed with the patient, family and they are in agreement.  CODE STATUS:FULL CODE    Code Status Orders        Start     Ordered   02/10/17 0638  Full code  Continuous     Feb 10, 2017 0638    Code Status History    Date Active Date Inactive Code Status Order ID Comments User Context   02/01/2015  4:18 PM 02/01/2015  9:22 PM Full Code [id number]  Annice Needy, MD Inpatient       TOTAL CRITICAL CARE TIME TAKING CARE OF THIS PATIENT: 55 minutes.    Ihor Austin M.D on February 10, 2017 at 6:44 AM  Between 7am to 6pm - Pager - [phone]  After 6pm go to [email or web address] - password [email or web address] [facility]  [facility] [place] [facility]  Office  [phone]  CC: Primary care physician; Marisue Ivan, MD

## 2017-01-23 NOTE — Code Documentation (Signed)
Samuel BoucheLucas removed, Pads placed. CPR resumed by tech Misty StanleyLisa.

## 2017-01-23 NOTE — Plan of Care (Signed)
Problem: Education: Goal: Knowledge of [facility] Education information/materials will improve Outcome: Completed/Met Date Met: 02/02/2017 Family oriented to room and waiting area and visiting hours

## 2017-01-23 NOTE — ED Notes (Signed)
Critical values reported to Dr Zenda AlpersWebster per Clinton SawyerKailey RN: Troponin 0.07 Lactic acid 6.6

## 2017-01-23 NOTE — Progress Notes (Signed)
Inpatient Diabetes Program Recommendations  AACE/ADA: New Consensus Statement on Inpatient Glycemic Control (2015)  Target Ranges:  Prepandial:   less than 140 mg/dL      Peak postprandial:   less than 180 mg/dL (1-2 hours)      Critically ill patients:  140 - 180 mg/dL   Lab Results  Component Value Date   GLUCAP 264 (H) 01/28/2017   HGBA1C 5.6 12/05/2014    Review of Glycemic Control  Results for Wayne [name], Wayne [name] (MRN [id number]) as of 01/28/2017 13:58  Ref. Range 02/07/2017 07:10 02/09/2017 08:27 01/17/2017 10:04 02/01/2017 11:07  Glucose-Capillary Latest Ref Range: 65 - 99 mg/dL 644289 (H) 034249 (H) 742264 (H) 264 (H)    Diabetes history: Type 2 Outpatient Diabetes medications: Metformin 500mg  qhs Current orders for Inpatient glycemic control: none  Inpatient Diabetes Program Recommendations:  Patient is intubated, on tube feeds, and has CBG> 200 mg/dl making him an ideal candidate for IV insulin using the ICU Glycemic Control phase 2 order set.   Susette RacerJulie Detra Bores, RN, BA, MHA, CDE Diabetes Coordinator Inpatient Diabetes Program  [phone] (Team Pager) [phone] [facility] Office) 02/02/2017 2:00 PM

## 2017-01-23 NOTE — ED Provider Notes (Signed)
[facility] Emergency Department Provider Note   ____________________________________________   First MD Initiated Contact with Patient 02/07/2017 0500     (approximate)  I have reviewed the triage vital signs and the nursing notes.   HISTORY  Chief Complaint CPR  Patient unresponsive and unable to answer questions  HPI Wayne [name] is a 61 y.o. male who was brought into the hospital today receiving CPR. According to EMS the wife and her husband was sleeping and she woke up hearing him family he was choking. He seemed to be having some difficulty breathing. She contacted her sister and her sister called 911. Per EMS he was down about 5-10 minutes before they arrived. The patient was in ventricular fibrillation on arrival. The patient had vomited prior to EMS arrival and prior to becoming unresponsive. Per EMS the patient was shocked 3 times and received 5 doses of epinephrine and 300 mg of amiodarone. The patient was brought in for evaluation.   Past Medical History:  Diagnosis Date  . Coronary artery disease   . Diabetes mellitus without complication (HCC)   . Hyperlipidemia   . Hypertension   . Myocardial infarction (HCC) 1996  . Peripheral neuropathy   . PVD (peripheral vascular disease) [facility])     Patient Active Problem List   Diagnosis Date Noted  . Cardiac arrest (HCC) 01/24/2017  . HTN (hypertension) 07/04/2016  . Diabetes mellitus type 2 in nonobese (HCC) 07/04/2016  . Hyperlipidemia 07/04/2016  . PAD (peripheral artery disease) (HCC) 07/04/2016    Past Surgical History:  Procedure Laterality Date  . Aortobifemoral bypass    . PERIPHERAL VASCULAR CATHETERIZATION N/A 02/01/2015   Procedure: Abdominal Aortogram w/Lower Extremity;  Surgeon: Annice Needy, MD;  Location: [facility] INVASIVE CV LAB;  Service: Cardiovascular;  Laterality: N/A;  . PERIPHERAL VASCULAR CATHETERIZATION  02/01/2015   Procedure: Lower Extremity Intervention;  Surgeon: Annice Needy, MD;  Location: [facility] INVASIVE CV LAB;  Service: Cardiovascular;;  . Right leg stents    . VASCULAR SURGERY    . WOUND DEBRIDEMENT Right 12/30/2014   Procedure: DEBRIDEMENT WOUND/RIGHT CALF I&D WITH WOUND VAC;  Surgeon: Annice Needy, MD;  Location: [facility] ORS;  Service: Vascular;  Laterality: Right;    Prior to Admission medications   Medication Sig Start Date End Date Taking? Authorizing Provider  allopurinol (ZYLOPRIM) 100 MG tablet Take 100 mg by mouth daily.    [provider]  aspirin 81 MG tablet Take 81 mg by mouth daily.    [provider]  carvedilol (COREG) 6.25 MG tablet Take 6.25 mg by mouth every morning.    [provider]  lisinopril (PRINIVIL,ZESTRIL) 20 MG tablet Take 20 mg by mouth every morning.    [provider]  metFORMIN (GLUCOPHAGE) 500 MG tablet Take 500 mg by mouth at bedtime.    [provider]  oxyCODONE-acetaminophen (PERCOCET) 7.5-325 MG per tablet Take 1 tablet by mouth every 6 (six) hours as needed for severe pain.    [provider]  rivaroxaban (XARELTO) 10 MG TABS tablet Take 10 mg by mouth daily.    [provider]  simvastatin (ZOCOR) 40 MG tablet Take 40 mg by mouth every morning.    [provider]  spironolactone (ALDACTONE) 25 MG tablet Take 25 mg by mouth daily.    [provider]    Allergies Patient has no known allergies.  Family History  Problem Relation Age of Onset  . Hypertension Mother   .  Hypertension Father   . Hypertension Brother     Social History Social History  Substance Use Topics  . Smoking status: Former Smoker    Packs/day: 2.00    Years: 20.00    Quit date: 12/28/2006  . Smokeless tobacco: Never Used  . Alcohol use No    Review of Systems  Unable to assess as patient is unresponsive and receiving radio pulmonary resuscitation  ____________________________________________   PHYSICAL EXAM:  VITAL SIGNS: ED Triage Vitals  Enc  Vitals Group     BP 2017/02/05 0507 (!) 158/127     Pulse Rate 2017-02-05 0507 64     Resp Feb 05, 2017 0507 15     Temp February 05, 2017 0516 (!) 85.4 F (29.7 C)     Temp Source Feb 05, 2017 0516 Other     SpO2 02-05-2017 0510 (!) 84 %     Weight 05-Feb-2017 0509 209 lb 11.2 oz (95.1 kg)     Height --      Head Circumference --      Peak Flow --      Pain Score --      Pain Loc --      Pain Edu? --      Excl. in GC? --     Constitutional: Unresponsive with agonal respirations Eyes: Conjunctivae are normal. Head: Atraumatic. Nose: Vomitus and the patient's nostrils  Mouth/Throat: Mucous membranes are dry. Cardiovascular: Normal rate, regular rhythm. Grossly normal heart sounds.  Good peripheral circulation. Respiratory: Increased respiratory effort.  Substernal retractions. Diminished breath sounds with some end expiratory wheezing Gastrointestinal: Soft and nontender. distention.  Musculoskeletal: No lower extremity tenderness nor edema.   Neurologic:  GCS 3, patient unresponsive Skin:  Skin is cool, dry and intact.  Psychiatric: Patient unresponsive  ____________________________________________   LABS (all labs ordered are listed, but only abnormal results are displayed)  Labs Reviewed  TROPONIN I - Abnormal; Notable for the following:       Result Value   Troponin I 0.07 (*)    All other components within normal limits  CBC WITH DIFFERENTIAL/PLATELET - Abnormal; Notable for the following:    WBC 16.6 (*)    MCH 25.6 (*)    MCHC 30.4 (*)    RDW 17.2 (*)    Neutro Abs 8.3 (*)    Lymphs Abs 7.4 (*)    All other components within normal limits  COMPREHENSIVE METABOLIC PANEL - Abnormal; Notable for the following:    Potassium 3.4 (*)    Chloride 100 (*)    Glucose, Bld 339 (*)    Creatinine, Ser 1.62 (*)    AST 95 (*)    GFR calc non Af Amer 45 (*)    GFR calc Af Amer 52 (*)    All other components within normal limits  LACTIC ACID, PLASMA - Abnormal; Notable for the following:     Lactic Acid, Venous 6.6 (*)    All other components within normal limits  BRAIN NATRIURETIC PEPTIDE - Abnormal; Notable for the following:    B Natriuretic Peptide 178.0 (*)    All other components within normal limits  BLOOD GAS, ARTERIAL - Abnormal; Notable for the following:    pH, Arterial 7.06 (*)    pCO2 arterial 68 (*)    Bicarbonate 19.3 (*)    Acid-base deficit 12.1 (*)    All other components within normal limits  CULTURE, BLOOD (ROUTINE X 2)  CULTURE, BLOOD (ROUTINE X 2)  LACTIC ACID, PLASMA  BLOOD GAS, ARTERIAL  BASIC METABOLIC PANEL  BASIC METABOLIC PANEL  BASIC METABOLIC PANEL  PROTIME-INR  PROTIME-INR  APTT  APTT  BLOOD GAS, ARTERIAL  BLOOD GAS, ARTERIAL  CBC  PROCALCITONIN   ____________________________________________  EKG  ED ECG REPORT I, Rebecka ApleyWebster,  Tullio Chausse P, the attending physician, personally viewed and interpreted this ECG.   Date: 01/20/2017  EKG Time: 0509  Rate: 67  Rhythm: normal sinus rhythm, RBBB  Axis: right axis deviatiom  Intervals:Wide QRS and prolonged QTC  ST&T Change: Flipped T waves in leads V1, V2, V3  ____________________________________________  RADIOLOGY  Ct Head Wo Contrast  Result Date: 01/28/2017 CLINICAL DATA:  Status post resuscitation. Patient found pulseless. Initial encounter. EXAM: CT HEAD WITHOUT CONTRAST TECHNIQUE: Contiguous axial images were obtained from the base of the skull through the vertex without intravenous contrast. COMPARISON:  None. FINDINGS: Brain: No evidence of acute infarction, hemorrhage, hydrocephalus, extra-axial collection or mass lesion/mass effect. Small chronic lacunar infarcts are seen at the basal ganglia bilaterally. The posterior fossa, including the cerebellum, brainstem and fourth ventricle, is within normal limits. The third and lateral ventricles are unremarkable in appearance. The cerebral hemispheres are symmetric in appearance, with normal gray-white differentiation. No mass effect or  midline shift is seen. Vascular: No hyperdense vessel or unexpected calcification. Skull: There is no evidence of fracture; visualized osseous structures are unremarkable in appearance. Sinuses/Orbits: The visualized portions of the orbits are within normal limits. There is partial opacification of the sphenoid sinus with fluid. The remaining paranasal sinuses and mastoid air cells are well-aerated. Other: No significant soft tissue abnormalities are seen. IMPRESSION: 1. No acute intracranial pathology seen on CT. 2. 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 3. Partial opacification of the sphenoid sinus. Electronically Signed   By: Roanna RaiderJeffery  Chang M.D.   On: 01/30/2017 06:29   Dg Chest Portable 1 View  Result Date: 01/22/2017 CLINICAL DATA:  Endotracheal tube placement. Status post resuscitation. Initial encounter. EXAM: PORTABLE CHEST 1 VIEW COMPARISON:  None. FINDINGS: The patient's endotracheal tube is seen ending 10 cm above the carina. This could be advanced 6 cm. Vascular congestion is noted. Increased interstitial markings raise concern for mild pulmonary edema. No pleural effusion or pneumothorax is seen. The cardiomediastinal silhouette is enlarged. External pacing pads are noted. No acute osseous abnormalities are seen. IMPRESSION: 1. Endotracheal tube seen ending 10 cm above the carina. This could be advanced 6 cm, as deemed clinically appropriate. 2. Vascular congestion and cardiomegaly. Increased interstitial markings raise concern for mild pulmonary edema. Electronically Signed   By: Roanna RaiderJeffery  Chang M.D.   On: 01/19/2017 05:36    ____________________________________________   PROCEDURES  Procedure(s) performed: please, see procedure note(s).  Procedure Name: Intubation Date/Time: 02/03/2017 5:00 AM Performed by: Rebecka ApleyWEBSTER, Draedyn Weidinger P Oxygen Delivery Method: Ambu bag Preoxygenation: Pre-oxygenation with 100% oxygen Ventilation: Mask ventilation without  difficulty Laryngoscope Size: Glidescope and 4 Tube size: 7.5 mm Number of attempts: 1 Airway Equipment and Method: Rigid stylet Placement Confirmation: ETT inserted through vocal cords under direct vision,  Positive ETCO2 and Breath sounds checked- equal and bilateral Secured at: 23 cm Tube secured with: ETT holder Difficulty Due To: Difficulty was anticipated       Critical Care performed: Yes, see critical care note(s)  CRITICAL CARE Performed by: Lucrezia EuropeWebster,  Quinnley Colasurdo P   Total critical care time: 45 minutes  Critical care time was exclusive of separately billable procedures and treating other patients.  Critical care was necessary to treat or prevent imminent or life-threatening  deterioration.  Critical care was time spent personally by me on the following activities: development of treatment plan with patient and/or surrogate as well as nursing, discussions with consultants, evaluation of patient's response to treatment, examination of patient, obtaining history from patient or surrogate, ordering and performing treatments and interventions, ordering and review of laboratory studies, ordering and review of radiographic studies, pulse oximetry and re-evaluation of patient's condition.  ____________________________________________   INITIAL IMPRESSION / ASSESSMENT AND PLAN / ED COURSE  Pertinent labs & imaging results that were available during my care of the patient were reviewed by me and considered in my medical decision making (see chart for details).  This is a 61 year old male who comes into the hospital today after undergoing cardiopulmonary arrest. The patient's pulse had returned by the time he arrived to the hospital. I did intubate the patient and we did suction some bloody fluid from his airway. The patient had some diminished breath sounds with some end expiratory wheezing. We initially gave the patient some fluids and negative the patient a dose of sodium  bicarbonate.  Clinical Course as of Jan 23 729  Tue 2017-02-16  0542 1. Endotracheal tube seen ending 10 cm above the carina. This could be advanced 6 cm, as deemed clinically appropriate. 2. Vascular congestion and cardiomegaly. Increased interstitial markings raise concern for mild pulmonary edema.   DG Chest Portable 1 View [AW]    Clinical Course User Index [AW] Rebecka Apley, MD   The patient's VBG came back with a pH of 7.06 and a PCO2 of 60. I did order some DuoNeb's for the patient with a concern for possible COPD the patient had a prolonged QTC sided give the patient some magnesium sulfate. Once I discovered that the patient's lactic acid was 6.6 I'll also gave him some normal saline. His white blood cell count is 16.6. We did advance the patient's endotracheal tube to ensure that it was in the appropriate position. I did contact the nurse practitioner in the intensive care unit to evaluate the patient as he will be going to the intensive care unit. She did a CT scan of the patient's head and the decision was made to cool the patient. The patient was admitted to the ICU service since he is post cardiac arrest. Although he does have some pulmonary edema on chest x-ray we will still give the patient some fluids. He he will be admitted.  ____________________________________________   FINAL CLINICAL IMPRESSION(S) / ED DIAGNOSES  Final diagnoses:  Lactic acidosis  Cardiopulmonary arrest (HCC)  Acute pulmonary edema (HCC)  Respiratory acidosis      NEW MEDICATIONS STARTED DURING THIS VISIT:  Current Discharge Medication List       Note:  This document was prepared using Dragon voice recognition software and may include unintentional dictation errors.    Rebecka Apley, MD 2017-02-16 0730

## 2017-01-23 NOTE — Consult Note (Signed)
PULMONARY / CRITICAL CARE MEDICINE   Name: Wayne [name] MRN: [id number] DOB: 1956-06-05    ADMISSION DATE:  02/21/17 CONSULTATION DATE: 02/21/2017  REFERRING MD:  Dr. Zenda Alpers for cardiac arrest.   CHIEF COMPLAINT: Cardiac Arrest   HISTORY OF PRESENT ILLNESS:   This is a 61 yo male with a PMH of Peripheral Vascular Disease, Peripheral Neuropathy, Myocardial Infarction (1996), HTN, Hyperlipidemia, Diabetes Mellitus, CAD, and Former Smoker.  He presented to [facility] ER via EMS 06/12 after pts wife found him choking and having difficulty breathing.  She contacted her sister and her sister notified EMS.  Upon EMS arrival the pt was pulseless in Vfibb arrest, therefore he was given 5 of epi, 300 mg amiodarone x1 dose, and shocked x3 en route to the ER with ROSC about 10 minutes following interventions.  Upon arrival to the ER pt mechanically intubated and given 1 amp of sodium bicarb lab results revealing pt in acute hypercapnic respiratory failure, metabolic acidosis, and lactic acidosis.  Therefore, pt admitted by hospitalist team to the ICU and hypothermic protocol initiated by PCCM team.   PAST MEDICAL HISTORY :  He  has a past medical history of Coronary artery disease; Diabetes mellitus without complication (HCC); Hyperlipidemia; Hypertension; Myocardial infarction (HCC) (1996); Peripheral neuropathy; and PVD (peripheral vascular disease) (HCC).  PAST SURGICAL HISTORY: He  has a past surgical history that includes Aortobifemoral bypass; Vascular surgery; Right leg stents; Wound debridement (Right, 12/30/2014); Cardiac catheterization (N/A, 02/01/2015); and Cardiac catheterization (02/01/2015).  No Known Allergies  No current facility-administered medications on file prior to encounter.    Current Outpatient Prescriptions on File Prior to Encounter  Medication Sig  . allopurinol (ZYLOPRIM) 100 MG tablet Take 100 mg by mouth daily.  Marland Kitchen aspirin 81 MG tablet Take 81 mg by mouth daily.  . carvedilol  (COREG) 6.25 MG tablet Take 6.25 mg by mouth every morning.  Marland Kitchen lisinopril (PRINIVIL,ZESTRIL) 20 MG tablet Take 20 mg by mouth every morning.  . metFORMIN (GLUCOPHAGE) 500 MG tablet Take 500 mg by mouth at bedtime.  Marland Kitchen oxyCODONE-acetaminophen (PERCOCET) 7.5-325 MG per tablet Take 1 tablet by mouth every 6 (six) hours as needed for severe pain.  . rivaroxaban (XARELTO) 10 MG TABS tablet Take 10 mg by mouth daily.  . simvastatin (ZOCOR) 40 MG tablet Take 40 mg by mouth every morning.  Marland Kitchen spironolactone (ALDACTONE) 25 MG tablet Take 25 mg by mouth daily.    FAMILY HISTORY:  His indicated that the status of his mother is unknown. He indicated that the status of his father is unknown. He indicated that the status of his brother is unknown.    SOCIAL HISTORY: He  reports that he quit smoking about 10 years ago. He has a 40.00 pack-year smoking history. He has never used smokeless tobacco. He reports that he does not drink alcohol or use drugs.  REVIEW OF SYSTEMS:   Unable to assess pt intubated   SUBJECTIVE:  Unable to assess pt intubated   VITAL SIGNS: BP (!) 123/92   Pulse 70   Temp (!) 95.8 F (35.4 C)   Resp 19   Wt 95.1 kg (209 lb 11.2 oz)   SpO2 95%   BMI 30.97 kg/m   HEMODYNAMICS:    VENTILATOR SETTINGS:    INTAKE / OUTPUT: No intake/output data recorded.  PHYSICAL EXAMINATION: General:  Acutely ill AA male in acute distress mechanically intubated Neuro: unresponsive, PERRL HEENT: supple, mild JVD Cardiovascular: nsr s1s2 with right BBB, no M/R/G Lungs:  agonal respirations, faint expiratory wheezes throughout, mechanically intubated Abdomen: +BS x4, distended, firm Musculoskeletal: normal bulk and tone no edema  Skin: intact no rashes or lesions   LABS:  BMET No results for input(s): NA, K, CL, CO2, BUN, CREATININE, GLUCOSE in the last 168 hours.  Electrolytes No results for input(s): CALCIUM, MG, PHOS in the last 168 hours.  CBC  Recent Labs Lab  01/29/2017 0511  WBC 16.6*  HGB 14.5  HCT 47.9  PLT 201    Coag's No results for input(s): APTT, INR in the last 168 hours.  Sepsis Markers  Recent Labs Lab 01/27/2017 0510  LATICACIDVEN 6.6*    ABG  Recent Labs Lab 02/10/2017 0522  PHART 7.06*  PCO2ART 68*  PO2ART 84    Liver Enzymes No results for input(s): AST, ALT, ALKPHOS, BILITOT, ALBUMIN in the last 168 hours.  Cardiac Enzymes No results for input(s): TROPONINI, PROBNP in the last 168 hours.  Glucose No results for input(s): GLUCAP in the last 168 hours.  Imaging Dg Chest Portable 1 View  Result Date: 01/16/2017 CLINICAL DATA:  Endotracheal tube placement. Status post resuscitation. Initial encounter. EXAM: PORTABLE CHEST 1 VIEW COMPARISON:  None. FINDINGS: The patient's endotracheal tube is seen ending 10 cm above the carina. This could be advanced 6 cm. Vascular congestion is noted. Increased interstitial markings raise concern for mild pulmonary edema. No pleural effusion or pneumothorax is seen. The cardiomediastinal silhouette is enlarged. External pacing pads are noted. No acute osseous abnormalities are seen. IMPRESSION: 1. Endotracheal tube seen ending 10 cm above the carina. This could be advanced 6 cm, as deemed clinically appropriate. 2. Vascular congestion and cardiomegaly. Increased interstitial markings raise concern for mild pulmonary edema. Electronically Signed   By: Roanna RaiderJeffery  Chang M.D.   On: 01/28/2017 05:36   STUDIES:  CT Head 06/12>>No acute intracranial pathology seen on CT. 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 Partial opacification of the sphenoid sinus  CULTURES: Blood 06/12>>  ANTIBIOTICS: Zosyn 06/12>> Vancomycin 06/12>>  SIGNIFICANT EVENTS: 06/12-Pt admitted to ICU post cardiac arrest mechanically intubated   LINES/TUBES: ETT 06/12>>  ASSESSMENT / PLAN:  PULMONARY A: Acute hypercapnic respiratory failure secondary to pulmonary edema and possible  aspiration  Mechanical intubation s/p cardiac arrest  Hx: Former smoker  P:   Initiate Hypothermic protocol @33  degrees C  Full vent support  Maintain O2 sats >92% Scheduled and prn bronchodilator therapy Repeat cxr in am  Stat ABG and then per hypothermic protocol   CARDIOVASCULAR A:  Vfibb Arrest of unknown etiology may be secondary to respiratory arrest  Mildly elevated troponin s/p cardiac arrest  Hx: Peripheral Vascular Disease, Myocardial Infarction, HTN, CAD, and Hyperlipidemia  P:  Cardiology consulted appreciate input Continuous telemetry monitoring  Trend troponin's Echo pending  Amiodarone gtt  EKG per hypothermia protocol   RENAL A:   Acute renal failure  Hypokalemia  Lactic acidosis Metabolic acidosis  P:   Trend BMP Replace electrolytes as indicated Monitor UOP Trend lactic acid CVP q4hr   GASTROINTESTINAL A:   Abdominal distention  P:   Orogastric tube @ LIS  Keep NPO for now   HEMATOLOGIC A:   No acute issues  P:  Trend CBC Monitor for s/sx of bleeding  Subq heparin for VTE prophylaxis  Transfuse for hgb <7 PT/INR per hypothermic protocol   INFECTIOUS A:   Possible aspiration pneumonia  Leukocytosis  P:   Trend WBC and monitor fever curve Trend PCT and lactic acid Continue  empiric abx Follow cultures   ENDOCRINE A:   Hx: Diabetes Mellitus P:   CBG's q1hr during hypothermia protocol  NEUROLOGIC A:   Acute encephalopathy s/p cardiac arrest P:   RASS goal: -5 for duration of neuromuscular blockade therapy  Shivering assessment BSA goal <1 q1hr during hypothermia protocol Nimbex gtt, Versed gtt, and Fentanyl gtt to maintain RASS goal -5 during cooling process of hypothermia protocol Neurology consulted appreciate input EEG pending No wakeup assessment during hypothermia protocol   FAMILY  - Updates: Pts family updated regarding plan of care and all questions answered informed family pt is very unstable 01/28/2017  -  Inter-disciplinary family meet or Palliative Care meeting due by: 01/30/17   Sonda [name], Juel Burrow  Pulmonary/Critical Care Pager [phone] (please enter 7 digits) PCCM Consult Pager [phone] (please enter 7 digits)  STAFF NOTE: I. Dr. Nicholos Johns, have personally reviewed the patient's available data including medical history , events of notes, physican examination and test results as part of my evaluation. I have discussed with the  Care with the NP and other care providers including  pharmacist, ICU RN, RRT, dietary.  Physical Exam Patient seen and examined with NP, agree with findings, assessment, plan. Patient is currently unresponsive on a ventilator and sedated, therefore, all history is obtained from staff, chart. Apparently the patient was found to be choking at home, he was not eating, he was in bed at the time. He was having difficulty breathing. Subsequently, he became unresponsive. He was found by EMS to be in V. fib arrest, he was resuscitated. Since arrival in the ICU. The patient was coded again briefly, with PEA arrest 5 minutes. He has been persistently hypoxic since that time. I have personally reviewed his chest x-ray films, this is consistent with diffuse pulmonary edema. Patient will be continued on hypothermia protocol, continue aggressive measures, discussed with family Lungs - Decreased air entry bilaterally with diffuse rhonchi.  Wells Guiles, MD.   Board Certified in Internal Medicine, Pulmonary Medicine, Critical Care Medicine, and Sleep Medicine.  [facility] Office Number: [phone] Pager: [phone]  Santiago Glad, M.D.  Billy Fischer, M.D 01/22/2017

## 2017-01-23 NOTE — ED Triage Notes (Addendum)
Pt arrived via ems from home via emergency traffic. Pt was found by wife who thought pt was choking. EMS arrived and no pulses present, estimated time down 12 minutes. VFib for reported by ems. EMS administered 5 rounds of epi, 3 amnioderone, and shocked pt 3 times in route.

## 2017-01-23 NOTE — ED Notes (Signed)
Wife and family at beside.

## 2017-01-23 NOTE — Progress Notes (Signed)
Pt. Was Coded. Ch. requested to give emotional support. Wife advised Ch. that she had enough family and emotional support. Family Pastor present and is high profile.

## 2017-01-23 NOTE — ED Notes (Signed)
EMS I/O removed.

## 2017-01-23 NOTE — Progress Notes (Signed)
Admitted this morning for cardiac arrest, Code ICE  protocol is being followed, patient seen in ICU, getting EEG this afternoon. Patient is on Levophed drip. Also on amiodarone drip.on fentanyl and vesrsed drip.  Lab data reviewed  Physical examination patient is currently intubated/, sedated Cardiovascular system S1, S2 regular Lungs bilateral coarse breath sounds, ,ronchi in upper lobes.  #1 cardiac arrest with unknown downtime, V. fib arrest by the time he came to ER, received multiple doses of epinephrine, amiodarone, shock or 3 times en route by EMS, intubated, admitted to ICU, code is protocol is followed, patient is being treated for cardiac arrest, septic shock, getting antibiotics, pressors, spoke with ICU RN Prognosis poor Time;25 min

## 2017-01-23 NOTE — Consult Note (Signed)
[facility] Cardiology Consultation Note  Patient ID: Wayne [name], MRN: [id number], DOB/AGE: 1955/11/18 61 y.o. Admit date: 01/28/2017   Date of Consult: 01/30/2017 Primary Physician: Dion Body, MD Primary Cardiologist: None  Chief Complaint:  Chief Complaint  Patient presents with  . CPR   Reason for Consult: acute unresponsive arrest  HPI: 61 y.o. male with known coronary artery disease essential hypertension mixed hyperlipidemia and severe peripheral vascular disease likely associated with diabetes with complication who was found unresponsive this morning by his wife. The patient was not having any responsiveness whatsoever and the patient underwent ACLS protocol for which the patient did have some hemodynamic response. There is not been any neurologic response at this time. Since then the patient has been intubated and placed in the intensive care unit for which the patient had a full code for which she appeared to be ventricular fibrillation for which the patient responded to appropriate amiodarone and ACLS protocol. The patient has been hemodynamically responsive at this point but is completely sedated. There has been only an elevation of troponin prior to this of 0.07 consistent oh with a non-acute coronary syndrome presentation although EKG has shown normal sinus rhythm with left axis deviation and right bundle-branch block. Currently the patient is hemodynamically stable and will need further treatment options  Past Medical History:  Diagnosis Date  . Coronary artery disease   . Diabetes mellitus without complication ([place])   . Hyperlipidemia   . Hypertension   . Myocardial infarction ([place]) 1996  . Peripheral neuropathy   . PVD (peripheral vascular disease) [facility])       Surgical History:  Past Surgical History:  Procedure Laterality Date  . Aortobifemoral bypass    . PERIPHERAL VASCULAR CATHETERIZATION N/A 02/01/2015   Procedure: Abdominal Aortogram w/Lower Extremity;   Surgeon: Algernon Huxley, MD;  Location: [facility] CV LAB;  Service: Cardiovascular;  Laterality: N/A;  . PERIPHERAL VASCULAR CATHETERIZATION  02/01/2015   Procedure: Lower Extremity Intervention;  Surgeon: Algernon Huxley, MD;  Location: [facility] CV LAB;  Service: Cardiovascular;;  . Right leg stents    . VASCULAR SURGERY    . WOUND DEBRIDEMENT Right 12/30/2014   Procedure: DEBRIDEMENT WOUND/RIGHT CALF I&D WITH WOUND VAC;  Surgeon: Algernon Huxley, MD;  Location: [facility] ORS;  Service: Vascular;  Laterality: Right;     Home Meds: Prior to Admission medications   Medication Sig Start Date End Date Taking? Authorizing Provider  allopurinol (ZYLOPRIM) 100 MG tablet Take 100 mg by mouth daily.    [provider]  aspirin 81 MG tablet Take 81 mg by mouth daily.    [provider]  carvedilol (COREG) 6.25 MG tablet Take 6.25 mg by mouth every morning.    [provider]  lisinopril (PRINIVIL,ZESTRIL) 20 MG tablet Take 20 mg by mouth every morning.    [provider]  metFORMIN (GLUCOPHAGE) 500 MG tablet Take 500 mg by mouth at bedtime.    [provider]  oxyCODONE-acetaminophen (PERCOCET) 7.5-325 MG per tablet Take 1 tablet by mouth every 6 (six) hours as needed for severe pain.    [provider]  rivaroxaban (XARELTO) 10 MG TABS tablet Take 10 mg by mouth daily.    [provider]  simvastatin (ZOCOR) 40 MG tablet Take 40 mg by mouth every morning.    [provider]  spironolactone (ALDACTONE) 25 MG tablet Take 25 mg by mouth daily.    [provider]    Inpatient Medications:  .  albuterol  2.5 mg Nebulization Q6H  . chlorhexidine gluconate (MEDLINE KIT)  15 mL Mouth Rinse BID  . cisatracurium  0.1 mg/kg Intravenous Once  . feeding supplement (PRO-STAT SUGAR FREE 64)  30 mL Per Tube TID  . feeding supplement (VITAL HIGH PROTEIN)  1,000 mL Per Tube Q24H  . fentaNYL (SUBLIMAZE) injection  100 mcg Intravenous Once  .  furosemide  40 mg Intravenous Q8H  . heparin  5,000 Units Subcutaneous Q8H  . mouth rinse  15 mL Mouth Rinse QID  . midazolam  2 mg Intravenous Once  . pantoprazole (PROTONIX) IV  40 mg Intravenous Daily  . polyvinyl alcohol  1 drop Both Eyes Q8H   . amiodarone 60 mg/hr (01/18/2017 1253)   Followed by  . amiodarone    . cisatracurium (NIMBEX) infusion    . fentaNYL infusion INTRAVENOUS 50 mcg/hr (02/08/2017 1300)  . midazolam (VERSED) infusion 2 mg/hr (01/14/2017 1300)  . norepinephrine (LEVOPHED) Adult infusion 15.013 mcg/min (02/07/2017 1300)  . piperacillin-tazobactam (ZOSYN)  IV Stopped (01/22/2017 1244)  . sodium chloride     And  . sodium chloride     And  . sodium chloride    . vancomycin      Allergies: No Known Allergies  Social History   Social History  . Marital status: Married    Spouse name: N/A  . Number of children: N/A  . Years of education: N/A   Occupational History  . Not on file.   Social History Main Topics  . Smoking status: Former Smoker    Packs/day: 2.00    Years: 20.00    Quit date: 12/28/2006  . Smokeless tobacco: Never Used  . Alcohol use No  . Drug use: No  . Sexual activity: Not on file   Other Topics Concern  . Not on file   Social History Narrative  . No narrative on file     Family History  Problem Relation Age of Onset  . Hypertension Mother   . Hypertension Father   . Hypertension Brother      Review of Systems Cannot assess Labs:  Recent Labs  01/22/2017 0511  TROPONINI 0.07*   Lab Results  Component Value Date   WBC 20.5 (H) 01/14/2017   HGB 14.6 01/17/2017   HCT 48.2 01/18/2017   MCV 81.5 01/15/2017   PLT 362 01/17/2017    Recent Labs Lab 01/29/2017 0914  NA 137  K 5.5*  CL 104  CO2 24  BUN 18  CREATININE 1.86*  CALCIUM 8.2*  PROT 6.6  BILITOT 0.6  ALKPHOS 132*  ALT 70*  AST 143*  GLUCOSE 260*   No results found for: CHOL, HDL, LDLCALC, TRIG No results found for: DDIMER  Radiology/Studies:  Ct  Head Wo Contrast  Result Date: 01/20/2017 CLINICAL DATA:  Status post resuscitation. Patient found pulseless. Initial encounter. EXAM: CT HEAD WITHOUT CONTRAST TECHNIQUE: Contiguous axial images were obtained from the base of the skull through the vertex without intravenous contrast. COMPARISON:  None. FINDINGS: Brain: No evidence of acute infarction, hemorrhage, hydrocephalus, extra-axial collection or mass lesion/mass effect. Small chronic lacunar infarcts are seen at the basal ganglia bilaterally. The posterior fossa, including the cerebellum, brainstem and fourth ventricle, is within normal limits. The third and lateral ventricles are unremarkable in appearance. The cerebral hemispheres are symmetric in appearance, with normal gray-white differentiation. No mass effect or midline shift is seen. Vascular: No hyperdense vessel or unexpected calcification. Skull: There is no evidence of fracture; visualized  osseous structures are unremarkable in appearance. Sinuses/Orbits: The visualized portions of the orbits are within normal limits. There is partial opacification of the sphenoid sinus with fluid. The remaining paranasal sinuses and mastoid air cells are well-aerated. Other: No significant soft tissue abnormalities are seen. IMPRESSION: 1. No acute intracranial pathology seen on CT. 2. 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 3. Partial opacification of the sphenoid sinus. Electronically Signed   By: Garald Balding M.D.   On: 01/22/2017 06:29   Dg Chest Port 1 View  Result Date: 02/10/2017 CLINICAL DATA:  Central line placement, recent cardiac arrest, intubated patient. EXAM: PORTABLE CHEST 1 VIEW COMPARISON:  Portable chest x-ray of January 23, 2017 at 5:11 a.m. FINDINGS: The patient has undergone placement of a left internal jugular venous catheter. The tip projects over the midportion of the SVC. There is no postprocedure pneumothorax. The interstitial markings remain increased  bilaterally greatest in the upper lobes where area of confluent density on the right has developed. The cardiac silhouette remains enlarged. The pulmonary vascularity is engorged and indistinct. External pacemaker defibrillator pads are present. The endotracheal tube tip lies approximately 8.8 cm above the carina. The tip is at this. Our margin of the clavicular heads. IMPRESSION: No postprocedure complication following left internal jugular venous catheter placement. High positioning of the endotracheal tube. Advancement by 4 cm is recommended. CHF with mild interstitial edema. Developing alveolar opacity in the right upper lobe may reflect pulmonary edema or pneumonia. Electronically Signed   By: David  Martinique M.D.   On: 01/22/2017 08:19   Dg Chest Portable 1 View  Result Date: 02/06/2017 CLINICAL DATA:  Endotracheal tube placement. Status post resuscitation. Initial encounter. EXAM: PORTABLE CHEST 1 VIEW COMPARISON:  None. FINDINGS: The patient's endotracheal tube is seen ending 10 cm above the carina. This could be advanced 6 cm. Vascular congestion is noted. Increased interstitial markings raise concern for mild pulmonary edema. No pleural effusion or pneumothorax is seen. The cardiomediastinal silhouette is enlarged. External pacing pads are noted. No acute osseous abnormalities are seen. IMPRESSION: 1. Endotracheal tube seen ending 10 cm above the carina. This could be advanced 6 cm, as deemed clinically appropriate. 2. Vascular congestion and cardiomegaly. Increased interstitial markings raise concern for mild pulmonary edema. Electronically Signed   By: Garald Balding M.D.   On: 01/26/2017 05:36    EKG: Normal sinus rhythm with left axis deviation and right bundle branch block  Weights: Filed Weights   02/06/2017 0509 01/29/2017 0800  Weight: 95.1 kg (209 lb 11.2 oz) 100.5 kg (221 lb 9 oz)     Physical Exam: Blood pressure (!) 87/75, pulse 99, temperature (!) 95.4 F (35.2 C), temperature  source Core (Comment), resp. rate 20, height 5' 7"  (1.702 m), weight 100.5 kg (221 lb 9 oz), SpO2 95 %. Body mass index is 34.7 kg/m. General: Well developed, well nourished, Intubated Head eyes ears nose throat: Normocephalic, atraumatic, sclera non-icteric, no xanthomas, nares are without discharge. No apparent thyromegaly and/or mass  Lungs: Normal respiratory effort.  Some wheezes, no rales, diffuse rhonchi.  Heart: RRR with normal S1 S2. no murmur gallop, no rub, PMI is normal size and placement, carotid upstroke normal without bruit, jugular venous pressure is normal Abdomen: Soft, , non-distended with normoactive bowel sounds. No hepatomegaly. No rebound/guarding. No obvious abdominal masses. Abdominal aorta is normal size without bruit Extremities: Trace edema. no cyanosis, no clubbing, no ulcers  Peripheral : 2+ bilateral upper extremity pulses, 2+ bilateral femoral pulses,  2+ bilateral dorsal pedal pulse Neuro: Not Alert and oriented. No facial asymmetry. No focal deficit. Does not Moves all extremities spontaneously. Musculoskeletal: Normal muscle tone without kyphosis Psych:  Does not Responds to questions appropriately with a normal affect.    Assessment: 61 year old male with known coronary artery disease diabetes with complication essential hypertension makes hyperlipidemia peripheral vascular disease found unresponsive currently without evidence of congestive heart failure symptoms prior to this or indication of acute coronary syndrome now hemodynamically stable after full arrest and possible ventricular fibrillation on appropriate medications after ACLS protocol  Plan: 1. Continue supportive care of a respiratory status due to obtundation 2. Continue amiodarone due to full arrest possibly due to ventricular fibrillation and responsiveness to ACLS protocol 3. Continue serial ECG and enzymes to assess for possible myocardial infarction 4. Mild for any significant pulmonary  edema 5. Further diagnostic testing and treatment options after above including echocardiogram for assessment of LV dysfunction  Signed, Corey Skains M.D. [facility] 01/29/2017, 1:22 PM

## 2017-01-23 NOTE — Progress Notes (Addendum)
Target temp of 33 degrees celsius met at 1500.  Maintenance phase begun.  Insulin drip initiated with ICU glycemic protocol.    Patient vomited pinkish emesis when turned for assessment and sacral dressing placement.  Tube feeds off, NG to LIS  NO UOP this shift, Md Kasa aware, DC lasix and continue to monitor kidney function

## 2017-01-23 NOTE — Progress Notes (Signed)
Nurse paged me and asked me if I can come and help a family member of a Pt in [place] who was distraught. CH met with family members and then visited with the Pt and offered silent prayers for the Pt. [place] provided emotion support and ministry of presence.    01/16/2017 1600  Clinical Encounter Type  Visited With Patient;Patient and family together  Visit Type Spiritual support;Other (Comment)  Referral From Nurse  Consult/Referral To [facility] (Comment)

## 2017-01-23 NOTE — ED Notes (Signed)
Bear hugger removed per MD verbal order.

## 2017-01-23 NOTE — ED Notes (Signed)
Pt intubated by MD Zenda AlpersWebster. ET measuring 23 at the lip

## 2017-01-23 NOTE — ED Notes (Signed)
Bear hugger placed

## 2017-01-23 NOTE — Progress Notes (Signed)
Patient in pulseless VFib on arrival to unit,  CPR performed, ROSC regained with very weak pulses, cooling intiated at approximately 0730.  Dr Ardyth Manam aware serum K was 5.5 after labs drawn from CL, 2nd run of IV K was not administered, CXR from AM indicates ETT needs to be advanced, per RT this has already been done.  OG tube would not pass obstruction, NG tube placed and radiology called.  EEG done. Pt is not responsive, no shivering, fentanyl and Versed infusing, no Nimbex required at this time.  MD aware.  Pt has had 0 UOP since admission to unit, MD aware, bladder scan performed to assess foley in correct position, no urine in bladder, pt's blood in safe set is extremely dark/black

## 2017-01-23 NOTE — Code Documentation (Signed)
Pulses intact.

## 2017-01-23 NOTE — Procedures (Signed)
Central Venous Catheter Placement: Indication: Patient receiving vesicant or irritant drug.; Patient receiving intravenous therapy for longer than 5 days.; Patient has limited or no vascular access.   Consent:emergent.   Risks and benefits explained in detail including risk of infection, bleeding, respiratory failure and death..   Hand washing performed prior to starting the procedure.   Procedure: An active timeout was performed and correct patient, name, & ID confirmed.  After explaining risk and benefits, patient was positioned correctly for central venous access. Patient was prepped using strict sterile technique including chlorohexadine preps, sterile drape, sterile gown and sterile gloves.  The area was prepped, draped and anesthetized in the usual sterile manner. Patient comfort was obtained.  A triple lumen catheter was placed in left Internal Jugular Vein There was good blood return, catheter caps were placed on lumens, catheter flushed easily, the line was secured and a sterile dressing and BIO-PATCH applied.   Ultrasound was used to visualize vasculature and guidance of needle.   Number of Attempts: 1 Complications:none Estimated Blood Loss: none Chest Radiograph indicated and ordered.  Operator: Wells Guileseep Minaal Struckman, M.D.   Wells Guileseep Mieke Brinley, M.D.  Pulmonary & Critical Care Medicine Intensive Care Unit

## 2017-01-24 ENCOUNTER — Inpatient Hospital Stay
Admit: 2017-01-24 | Discharge: 2017-01-24 | Disposition: A | Discharge status: Home / Self Care | Payer: BLUE CROSS/BLUE SHIELD | Attending: Critical Care Medicine | Admitting: Critical Care Medicine

## 2017-01-24 DIAGNOSIS — I469 Cardiac arrest, cause unspecified: Secondary | ICD-10-CM

## 2017-01-24 DIAGNOSIS — G931 Anoxic brain damage, not elsewhere classified: Secondary | ICD-10-CM

## 2017-01-24 LAB — PROCALCITONIN: PROCALCITONIN: 71.48 ng/mL

## 2017-01-24 LAB — CBC
HEMATOCRIT: 46.8 % (ref 40.0–52.0)
HEMOGLOBIN: 14.8 g/dL (ref 13.0–18.0)
MCH: 25.5 pg — ABNORMAL LOW (ref 26.0–34.0)
MCHC: 31.5 g/dL — ABNORMAL LOW (ref 32.0–36.0)
MCV: 80.7 fL (ref 80.0–100.0)
Platelets: 154 10*3/uL (ref 150–440)
RBC: 5.8 MIL/uL (ref 4.40–5.90)
RDW: 16.6 % — ABNORMAL HIGH (ref 11.5–14.5)
WBC: 10.8 10*3/uL — AB (ref 3.8–10.6)

## 2017-01-24 LAB — BASIC METABOLIC PANEL
ANION GAP: 12 (ref 5–15)
ANION GAP: 11 (ref 5–15)
Anion gap: 9 (ref 5–15)
Anion gap: 11 (ref 5–15)
Anion gap: 13 (ref 5–15)
Anion gap: 13 (ref 5–15)
BUN: 29 mg/dL — AB (ref 6–20)
BUN: 31 mg/dL — ABNORMAL HIGH (ref 6–20)
BUN: 33 mg/dL — ABNORMAL HIGH (ref 6–20)
BUN: 37 mg/dL — ABNORMAL HIGH (ref 6–20)
BUN: 35 mg/dL — ABNORMAL HIGH (ref 6–20)
BUN: 42 mg/dL — ABNORMAL HIGH (ref 6–20)
CALCIUM: 7.3 mg/dL — AB (ref 8.9–10.3)
CALCIUM: 6.8 mg/dL — AB (ref 8.9–10.3)
CALCIUM: 6.8 mg/dL — AB (ref 8.9–10.3)
CALCIUM: 6.5 mg/dL — AB (ref 8.9–10.3)
CHLORIDE: 104 mmol/L (ref 101–111)
CO2: 28 mmol/L (ref 22–32)
CO2: 26 mmol/L (ref 22–32)
CO2: 26 mmol/L (ref 22–32)
CO2: 27 mmol/L (ref 22–32)
CO2: 25 mmol/L (ref 22–32)
CO2: 27 mmol/L (ref 22–32)
CREATININE: 2.94 mg/dL — AB (ref 0.61–1.24)
CREATININE: 3.01 mg/dL — AB (ref 0.61–1.24)
CREATININE: 3.38 mg/dL — AB (ref 0.61–1.24)
CREATININE: 3.22 mg/dL — AB (ref 0.61–1.24)
CREATININE: 3.84 mg/dL — AB (ref 0.61–1.24)
Calcium: 7.4 mg/dL — ABNORMAL LOW (ref 8.9–10.3)
Calcium: 6.6 mg/dL — ABNORMAL LOW (ref 8.9–10.3)
Chloride: 102 mmol/L (ref 101–111)
Chloride: 101 mmol/L (ref 101–111)
Chloride: 99 mmol/L — ABNORMAL LOW (ref 101–111)
Chloride: 99 mmol/L — ABNORMAL LOW (ref 101–111)
Chloride: 98 mmol/L — ABNORMAL LOW (ref 101–111)
Creatinine, Ser: 2.92 mg/dL — ABNORMAL HIGH (ref 0.61–1.24)
GFR calc Af Amer: 25 mL/min — ABNORMAL LOW (ref >60)
GFR calc non Af Amer: 22 mL/min — ABNORMAL LOW (ref >60)
GFR calc non Af Amer: 16 mL/min — ABNORMAL LOW (ref >60)
GFR, EST AFRICAN AMERICAN: 25 mL/min — AB (ref >60)
GFR, EST AFRICAN AMERICAN: 24 mL/min — AB (ref >60)
GFR, EST AFRICAN AMERICAN: 21 mL/min — AB (ref >60)
GFR, EST AFRICAN AMERICAN: 23 mL/min — AB (ref >60)
GFR, EST AFRICAN AMERICAN: 18 mL/min — AB (ref >60)
GFR, EST NON AFRICAN AMERICAN: 22 mL/min — AB (ref >60)
GFR, EST NON AFRICAN AMERICAN: 21 mL/min — AB (ref >60)
GFR, EST NON AFRICAN AMERICAN: 18 mL/min — AB (ref >60)
GFR, EST NON AFRICAN AMERICAN: 19 mL/min — AB (ref >60)
GLUCOSE: 151 mg/dL — AB (ref 65–99)
GLUCOSE: 132 mg/dL — AB (ref 65–99)
GLUCOSE: 129 mg/dL — AB (ref 65–99)
Glucose, Bld: 121 mg/dL — ABNORMAL HIGH (ref 65–99)
Glucose, Bld: 127 mg/dL — ABNORMAL HIGH (ref 65–99)
Glucose, Bld: 152 mg/dL — ABNORMAL HIGH (ref 65–99)
POTASSIUM: 3.8 mmol/L (ref 3.5–5.1)
Potassium: 4 mmol/L (ref 3.5–5.1)
Potassium: 4.5 mmol/L (ref 3.5–5.1)
Potassium: 5.1 mmol/L (ref 3.5–5.1)
Potassium: 4.8 mmol/L (ref 3.5–5.1)
Potassium: 5.8 mmol/L — ABNORMAL HIGH (ref 3.5–5.1)
SODIUM: 140 mmol/L (ref 135–145)
SODIUM: 138 mmol/L (ref 135–145)
Sodium: 141 mmol/L (ref 135–145)
Sodium: 137 mmol/L (ref 135–145)
Sodium: 137 mmol/L (ref 135–145)
Sodium: 138 mmol/L (ref 135–145)

## 2017-01-24 LAB — URINALYSIS, COMPLETE (UACMP) WITH MICROSCOPIC
Bacteria, UA: NONE SEEN
Bilirubin Urine: NEGATIVE
GLUCOSE, UA: 50 mg/dL — AB
KETONES UR: 5 mg/dL — AB
Leukocytes, UA: NEGATIVE
NITRITE: NEGATIVE
PH: 5 (ref 5.0–8.0)
PROTEIN: 30 mg/dL — AB
Specific Gravity, Urine: 1.028 (ref 1.005–1.030)
Squamous Epithelial / LPF: NONE SEEN
WBC UA: NONE SEEN WBC/hpf (ref 0–5)

## 2017-01-24 LAB — GLUCOSE, CAPILLARY
GLUCOSE-CAPILLARY: 109 mg/dL — AB (ref 65–99)
GLUCOSE-CAPILLARY: 115 mg/dL — AB (ref 65–99)
GLUCOSE-CAPILLARY: 134 mg/dL — AB (ref 65–99)
GLUCOSE-CAPILLARY: 149 mg/dL — AB (ref 65–99)
GLUCOSE-CAPILLARY: 150 mg/dL — AB (ref 65–99)
GLUCOSE-CAPILLARY: 152 mg/dL — AB (ref 65–99)
GLUCOSE-CAPILLARY: 158 mg/dL — AB (ref 65–99)
GLUCOSE-CAPILLARY: 71 mg/dL (ref 65–99)
Glucose-Capillary: 152 mg/dL — ABNORMAL HIGH (ref 65–99)
Glucose-Capillary: 164 mg/dL — ABNORMAL HIGH (ref 65–99)
Glucose-Capillary: 152 mg/dL — ABNORMAL HIGH (ref 65–99)
Glucose-Capillary: 149 mg/dL — ABNORMAL HIGH (ref 65–99)
Glucose-Capillary: 170 mg/dL — ABNORMAL HIGH (ref 65–99)
Glucose-Capillary: 170 mg/dL — ABNORMAL HIGH (ref 65–99)
Glucose-Capillary: 151 mg/dL — ABNORMAL HIGH (ref 65–99)
Glucose-Capillary: 138 mg/dL — ABNORMAL HIGH (ref 65–99)
Glucose-Capillary: 123 mg/dL — ABNORMAL HIGH (ref 65–99)
Glucose-Capillary: 41 mg/dL — CL (ref 65–99)
Glucose-Capillary: 123 mg/dL — ABNORMAL HIGH (ref 65–99)
Glucose-Capillary: 101 mg/dL — ABNORMAL HIGH (ref 65–99)
Glucose-Capillary: 56 mg/dL — ABNORMAL LOW (ref 65–99)
Glucose-Capillary: 53 mg/dL — ABNORMAL LOW (ref 65–99)
Glucose-Capillary: 56 mg/dL — ABNORMAL LOW (ref 65–99)
Glucose-Capillary: 67 mg/dL (ref 65–99)
Glucose-Capillary: 124 mg/dL — ABNORMAL HIGH (ref 65–99)
Glucose-Capillary: 139 mg/dL — ABNORMAL HIGH (ref 65–99)

## 2017-01-24 LAB — COMPREHENSIVE METABOLIC PANEL
ALT: 59 U/L (ref 17–63)
ANION GAP: 9 (ref 5–15)
AST: 146 U/L — ABNORMAL HIGH (ref 15–41)
Albumin: 2.7 g/dL — ABNORMAL LOW (ref 3.5–5.0)
Alkaline Phosphatase: 59 U/L (ref 38–126)
BILIRUBIN TOTAL: 1 mg/dL (ref 0.3–1.2)
BUN: 31 mg/dL — ABNORMAL HIGH (ref 6–20)
CO2: 27 mmol/L (ref 22–32)
Calcium: 7.4 mg/dL — ABNORMAL LOW (ref 8.9–10.3)
Chloride: 103 mmol/L (ref 101–111)
Creatinine, Ser: 2.92 mg/dL — ABNORMAL HIGH (ref 0.61–1.24)
GFR calc Af Amer: 25 mL/min — ABNORMAL LOW (ref >60)
GFR, EST NON AFRICAN AMERICAN: 22 mL/min — AB (ref >60)
Glucose, Bld: 141 mg/dL — ABNORMAL HIGH (ref 65–99)
POTASSIUM: 3.9 mmol/L (ref 3.5–5.1)
Sodium: 139 mmol/L (ref 135–145)
Total Protein: 5.2 g/dL — ABNORMAL LOW (ref 6.5–8.1)

## 2017-01-24 LAB — BLOOD GAS, ARTERIAL
ACID-BASE DEFICIT: 12.1 mmol/L — AB (ref 0.0–2.0)
ACID-BASE DEFICIT: 12.1 mmol/L — AB (ref 0.0–2.0)
BICARBONATE: 19.3 mmol/L — AB (ref 20.0–28.0)
BICARBONATE: 14.1 mmol/L — AB (ref 20.0–28.0)
FIO2: 1
FIO2: 0.8
LHR: 14 {breaths}/min
MECHVT: 550 mL
Mechanical Rate: 14
O2 Saturation: 90.5 %
O2 Saturation: 99.5 %
PCO2 ART: 33 mmHg (ref 32.0–48.0)
PEEP/CPAP: 5 cmH2O
PH ART: 7.24 — AB (ref 7.350–7.450)
Patient temperature: 37
Patient temperature: 37
pCO2 arterial: 68 mmHg (ref 32.0–48.0)
pH, Arterial: 7.06 — CL (ref 7.350–7.450)
pO2, Arterial: 84 mmHg (ref 83.0–108.0)
pO2, Arterial: 188 mmHg — ABNORMAL HIGH (ref 83.0–108.0)

## 2017-01-24 LAB — ECHOCARDIOGRAM COMPLETE
Height: 67 in
Weight: 3460.34 oz

## 2017-01-24 LAB — TROPONIN I: Troponin I: 0.97 ng/mL (ref <0.03)

## 2017-01-24 LAB — PROTIME-INR
INR: 1.34
PROTHROMBIN TIME: 16.7 s — AB (ref 11.4–15.2)

## 2017-01-24 LAB — MAGNESIUM: MAGNESIUM: 2.2 mg/dL (ref 1.7–2.4)

## 2017-01-24 LAB — LACTIC ACID, PLASMA: Lactic Acid, Venous: 2.8 mmol/L (ref 0.5–1.9)

## 2017-01-24 MED ORDER — VASOPRESSIN 20 UNIT/ML IV SOLN
0.0300 [IU]/min | INTRAVENOUS | Status: DC
  Administered 2017-01-24 (×2): 0.03 [IU]/min via INTRAVENOUS
  Filled 2017-01-24 (×2): qty 2

## 2017-01-24 MED ORDER — DEXTROSE 5 % IV SOLN
INTRAVENOUS | Status: DC
  Administered 2017-01-24: 10:00:00 via INTRAVENOUS
  Filled 2017-01-24 (×4): qty 150

## 2017-01-24 MED ORDER — INSULIN REGULAR HUMAN 100 UNIT/ML IJ SOLN
10.0000 [IU] | Freq: Once | INTRAMUSCULAR | Status: AC
  Administered 2017-01-25: 10 [IU] via INTRAVENOUS
  Filled 2017-01-24: qty 0.1

## 2017-01-24 MED ORDER — SODIUM BICARBONATE 8.4 % IV SOLN
INTRAVENOUS | Status: DC
  Administered 2017-01-24: 04:00:00 via INTRAVENOUS
  Filled 2017-01-24: qty 850

## 2017-01-24 MED ORDER — DEXTROSE 50 % IV SOLN: 25.0000 mL | Freq: Once | INTRAVENOUS | Status: DC

## 2017-01-24 MED ORDER — SODIUM BICARBONATE 8.4 % IV SOLN: INTRAVENOUS | Status: DC

## 2017-01-24 MED ORDER — SODIUM BICARBONATE 8.4 % IV SOLN
150.0000 meq | Freq: Once | INTRAVENOUS | Status: AC
  Administered 2017-01-24: 150 meq via INTRAVENOUS
  Filled 2017-01-24: qty 150

## 2017-01-24 MED ORDER — ORAL CARE MOUTH RINSE
15.0000 mL | OROMUCOSAL | Status: DC
  Administered 2017-01-24 – 2017-01-25 (×12): 15 mL via OROMUCOSAL

## 2017-01-24 MED ORDER — SODIUM BICARBONATE 8.4 % IV SOLN
INTRAVENOUS | Status: DC
  Filled 2017-01-24: qty 150

## 2017-01-24 MED ORDER — CHLORHEXIDINE GLUCONATE 0.12% ORAL RINSE (MEDLINE KIT)
15.0000 mL | Freq: Two times a day (BID) | OROMUCOSAL | Status: DC
  Administered 2017-01-24 – 2017-01-25 (×2): 15 mL via OROMUCOSAL

## 2017-01-24 MED ORDER — INSULIN ASPART 100 UNIT/ML ~~LOC~~ SOLN
2.0000 [IU] | SUBCUTANEOUS | Status: DC
  Administered 2017-01-24: 2 [IU] via SUBCUTANEOUS
  Administered 2017-01-24: 4 [IU] via SUBCUTANEOUS
  Administered 2017-01-24: 2 [IU] via SUBCUTANEOUS
  Filled 2017-01-24 (×3): qty 1

## 2017-01-24 MED FILL — Medication: Qty: 1 | Status: AC

## 2017-01-24 NOTE — Progress Notes (Signed)
[facility] Encounter Note  Patient: Wayne [name] / Admit Date: 02/02/2017 / Date of Encounter: 01/24/2017, 1:24 PM   Subjective: Patient is hemodynamically stable at this time but is requiring pressor support for this blood pressure. Patient has not shown any neurologic signs of movement. Patient is having no evidence of further episodes of ventricular fibrillation. Patient has severe multiorgan failure Echocardiogram shows severe segmental LV systolic dysfunction with posterior lateral hypokinesis and akinesis of the apex consistent with previous myocardial infarction and ejection fraction of 20% Current troponin levels suggest that this LV systolic dysfunction and myocardial infarction happened in the remote past Review of Systems:  Cannot assess Objective: Telemetry: Sinus tachycardia Physical Exam: Blood pressure 98/73, pulse 80, temperature (!) 90.9 F (32.7 C), temperature source Core (Comment), resp. rate 18, height _0  (1.702 m), weight 98.1 kg (216 lb 4.3 oz), SpO2 97 %. Body mass index is 33.87 kg/m. General: Well developed, well nourished, intubated Head: Normocephalic, atraumatic, sclera non-icteric, no xanthomas, nares are without discharge. Neck: No apparent masses Lungs: Normal respirations with no wheezes, no rhonchi, no rales , no crackles   Heart: Regular rate and rhythm, normal S1 S2, no murmur, no rub, no gallop, PMI is normal size and placement, carotid upstroke normal without bruit, jugular venous pressure normal Abdomen: Soft,   non-distended with normoactive bowel sounds. No hepatosplenomegaly. Abdominal aorta is normal size without bruit Extremities: 2+ edema, no clubbing, no cyanosis, no ulcers,  Peripheral: 2+ radial, 2+ femoral, 2+ dorsal pedal pulses Neuro: It is not Alert and oriented. Does not Moves all extremities spontaneously. Psych:  Does not Responds to questions appropriately with a normal affect.   Intake/Output Summary (Last 24  hours) at 01/24/17 1324 Last data filed at 01/24/17 1300  Gross per 24 hour  Intake          3154.92 ml  Output              105 ml  Net          3049.92 ml    Inpatient Medications:  . albuterol  2.5 mg Nebulization Q6H  . chlorhexidine gluconate (MEDLINE KIT)  15 mL Mouth Rinse BID  . cisatracurium  0.1 mg/kg Intravenous Once  . feeding supplement (PRO-STAT SUGAR FREE 64)  30 mL Per Tube TID  . feeding supplement (VITAL HIGH PROTEIN)  1,000 mL Per Tube Q24H  . heparin  5,000 Units Subcutaneous Q8H  . insulin aspart  2-6 Units Subcutaneous Q4H  . mouth rinse  15 mL Mouth Rinse 10 times per day  . midazolam  2 mg Intravenous Once  . pantoprazole (PROTONIX) IV  40 mg Intravenous Daily  . polyvinyl alcohol  1 drop Both Eyes Q8H   Infusions:  . amiodarone 30 mg/hr (01/24/17 1300)  . cisatracurium (NIMBEX) infusion    . fentaNYL infusion INTRAVENOUS 50 mcg/hr (01/24/17 1300)  . midazolam (VERSED) infusion 2 mg/hr (01/24/17 1300)  . norepinephrine (LEVOPHED) Adult infusion 25 mcg/min (01/24/17 1320)  . piperacillin-tazobactam (ZOSYN)  IV Stopped (01/24/17 1034)  .  sodium bicarbonate  infusion 1000 mL 75 mL/hr at 01/24/17 1300  . sodium chloride     And  . sodium chloride     And  . sodium chloride    . vancomycin 1,250 mg (01/24/17 1206)  . vasopressin (PITRESSIN) infusion - *FOR SHOCK* 0.03 Units/min (01/24/17 1300)    Labs:  Recent Labs  01/27/2017 0914  01/24/17 0704 01/24/17 1053  NA 137  < >  140 138  K 5.5*  < > 4.0 4.5  CL 104  < > 102 101  CO2 24  < > 26 26  GLUCOSE 260*  < > 121* 127*  BUN 18  < > 31* 33*  CREATININE 1.86*  < > 2.94* 3.01*  CALCIUM 8.2*  < > 7.3* 6.8*  MG 3.7*  --   --   --   PHOS 5.2*  --   --   --   < > = values in this interval not displayed.  Recent Labs  01/24/2017 0914 01/24/17 0500  AST 143* 146*  ALT 70* 59  ALKPHOS 132* 59  BILITOT 0.6 1.0  PROT 6.6 5.2*  ALBUMIN 3.5 2.7*    Recent Labs  02/06/2017 0511 01/18/2017 0914  01/24/17 0500  WBC 16.6* 20.5* 10.8*  NEUTROABS 8.3*  --   --   HGB 14.5 14.6 14.8  HCT 47.9 48.2 46.8  MCV 84.4 81.5 80.7  PLT 201 362 154    Recent Labs  01/31/2017 0511 01/24/17 0034  TROPONINI 0.07* 0.97*   Invalid input(s): POCBNP No results for input(s): HGBA1C in the last 72 hours.   Weights: Filed Weights   02/10/2017 0509 01/15/2017 0800 01/24/17 0447  Weight: 95.1 kg (209 lb 11.2 oz) 100.5 kg (221 lb 9 oz) 98.1 kg (216 lb 4.3 oz)     Radiology/Studies:  Ct Head Wo Contrast  Result Date: 01/28/2017 CLINICAL DATA:  Status post resuscitation. Patient found pulseless. Initial encounter. EXAM: CT HEAD WITHOUT CONTRAST TECHNIQUE: Contiguous axial images were obtained from the base of the skull through the vertex without intravenous contrast. COMPARISON:  None. FINDINGS: Brain: No evidence of acute infarction, hemorrhage, hydrocephalus, extra-axial collection or mass lesion/mass effect. Small chronic lacunar infarcts are seen at the basal ganglia bilaterally. The posterior fossa, including the cerebellum, brainstem and fourth ventricle, is within normal limits. The third and lateral ventricles are unremarkable in appearance. The cerebral hemispheres are symmetric in appearance, with normal gray-white differentiation. No mass effect or midline shift is seen. Vascular: No hyperdense vessel or unexpected calcification. Skull: There is no evidence of fracture; visualized osseous structures are unremarkable in appearance. Sinuses/Orbits: The visualized portions of the orbits are within normal limits. There is partial opacification of the sphenoid sinus with fluid. The remaining paranasal sinuses and mastoid air cells are well-aerated. Other: No significant soft tissue abnormalities are seen. IMPRESSION: 1. No acute intracranial pathology seen on CT. 2. 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 3. Partial opacification of the sphenoid sinus. Electronically Signed   By:  Garald Balding M.D.   On: 01/22/2017 06:29   Dg Chest Port 1 View  Result Date: 01/22/2017 CLINICAL DATA:  Central line placement, recent cardiac arrest, intubated patient. EXAM: PORTABLE CHEST 1 VIEW COMPARISON:  Portable chest x-ray of January 23, 2017 at 5:11 a.m. FINDINGS: The patient has undergone placement of a left internal jugular venous catheter. The tip projects over the midportion of the SVC. There is no postprocedure pneumothorax. The interstitial markings remain increased bilaterally greatest in the upper lobes where area of confluent density on the right has developed. The cardiac silhouette remains enlarged. The pulmonary vascularity is engorged and indistinct. External pacemaker defibrillator pads are present. The endotracheal tube tip lies approximately 8.8 cm above the carina. The tip is at this. Our margin of the clavicular heads. IMPRESSION: No postprocedure complication following left internal jugular venous catheter placement. High positioning of the endotracheal tube. Advancement by 4  cm is recommended. CHF with mild interstitial edema. Developing alveolar opacity in the right upper lobe may reflect pulmonary edema or pneumonia. Electronically Signed   By: David  Martinique M.D.   On: 01/18/2017 08:19   Dg Chest Portable 1 View  Result Date: 01/27/2017 CLINICAL DATA:  Endotracheal tube placement. Status post resuscitation. Initial encounter. EXAM: PORTABLE CHEST 1 VIEW COMPARISON:  None. FINDINGS: The patient's endotracheal tube is seen ending 10 cm above the carina. This could be advanced 6 cm. Vascular congestion is noted. Increased interstitial markings raise concern for mild pulmonary edema. No pleural effusion or pneumothorax is seen. The cardiomediastinal silhouette is enlarged. External pacing pads are noted. No acute osseous abnormalities are seen. IMPRESSION: 1. Endotracheal tube seen ending 10 cm above the carina. This could be advanced 6 cm, as deemed clinically appropriate. 2.  Vascular congestion and cardiomegaly. Increased interstitial markings raise concern for mild pulmonary edema. Electronically Signed   By: Garald Balding M.D.   On: 01/19/2017 05:36   Dg Abd Portable 1v  Result Date: 01/27/2017 CLINICAL DATA:  Nasogastric tube placement. EXAM: PORTABLE ABDOMEN - 1 VIEW COMPARISON:  Radiograph of same day. FINDINGS: The bowel gas pattern is normal. No radio-opaque calculi or other significant radiographic abnormality are seen. Nasogastric tube tip is seen in body of stomach. IMPRESSION: Nasogastric tube tip seen in gastric lumen. No evidence of bowel obstruction or ileus. Electronically Signed   By: Marijo Conception, M.D.   On: 01/13/2017 14:47   Dg Abd Portable 1v  Result Date: 01/28/2017 CLINICAL DATA:  Status post NG tube placement today. EXAM: PORTABLE ABDOMEN - 1 VIEW COMPARISON:  None. FINDINGS: No NG tube is identified. Mild gaseous distention of the stomach noted. IMPRESSION: NG tube is not visualized. Electronically Signed   By: Inge Rise M.D.   On: 01/29/2017 13:42     Assessment and Recommendation  61 y.o. male with known coronary artery disease essential hypertension and mixed hyperlipidemia diabetes with an acute arrest consistent with a ventricular arrhythmia likely causing arrest due to severe dilated cardiomyopathy and low ejection fraction without evidence of myocardial infarction at this time 1. Continue supportive care after cardiopulmonary arrest with pressor support as able 2. No further cardiac diagnostics at this time until further evaluation neurologically 3. Poor prognosis due to the poor signs and multiorgan failure  Signed, Serafina Royals M.D. FACC

## 2017-01-24 NOTE — Progress Notes (Signed)
Dr Ardyth Manam notified of venous blood glucose < 70, writer made two draws and repeated test several times.  Lowest reading was 41, highest was 63.  Per Dr Ardyth Manam change bi carb drip from sterile water to D5.  Levo titrated up x 2 this AM d/t MAP < 80.  Radial pulses can be dopplered.  Femoral and popliteal pulses cannot be found at this time.  Will continue to check as pressure improves.

## 2017-01-24 NOTE — Progress Notes (Signed)
Inpatient Diabetes Program Recommendations  AACE/ADA: New Consensus Statement on Inpatient Glycemic Control (2015)  Target Ranges:  Prepandial:   less than 140 mg/dL      Peak postprandial:   less than 180 mg/dL (1-2 hours)      Critically ill patients:  140 - 180 mg/dL   Results for [name], Scotti (MRN [id number]) as of 01/24/2017 10:04  Ref. Range 02/04/2017 20:14 02/04/2017 21:10 01/26/2017 22:09 01/27/2017 23:05 01/24/2017 00:10 01/24/2017 01:09 01/24/2017 02:11 01/24/2017 03:07 01/24/2017 04:13  Glucose-Capillary Latest Ref Range: 65 - 99 mg/dL 130150 (H) 865109 (H) 784149 (H) 149 (H) 158 (H) 170 (H) 170 (H) 151 (H) 134 (H)   Review of Glycemic Control  Diabetes history: DM2 Outpatient Diabetes medications: Metformin 500 mg QHS Current orders for Inpatient glycemic control: Novolin R insulin drip  Inpatient Diabetes Program Recommendations: Correction (SSI): Order for IV insulin but no actual order set ordered. Recommend discontinuing single IV insulin order and use ICU order set to order CBGs and Novolog correction Q4H.  Thanks, Orlando PennerMarie Jakyla Reza, RN, MSN, CDE Diabetes Coordinator Inpatient Diabetes Program [phone] (Team Pager from 8am to 5pm)

## 2017-01-24 NOTE — Progress Notes (Addendum)
Rewarming initiated at 1445.  VSS, no s.sx distress, no shivering, pt is not responsive.  [employer], clinicalun machine automatic rewarming settings verified by Terie Purser Foster, RN  CCMD advised to monitor pt for arrhythmias and notify writer at once.

## 2017-01-24 NOTE — Consult Note (Signed)
PULMONARY / CRITICAL CARE MEDICINE   Name: Wayne [name] MRN: [id number] DOB: 12-27-1955    ADMISSION DATE:  02/13/2017   Subjective  Patient is sedated, on the ventilator.  REVIEW OF SYSTEMS:   Unable to assess pt intubated    VITAL SIGNS: BP 98/73   Pulse 80   Temp (!) 90.9 F (32.7 C) (Core (Comment))   Resp 18   Ht 5\' 7"  (1.702 m)   Wt 98.1 kg (216 lb 4.3 oz) Comment: artic sun in place  SpO2 97%   BMI 33.87 kg/m   HEMODYNAMICS: CVP:  [0 mmHg-21 mmHg] 9 mmHg  VENTILATOR SETTINGS: Vent Mode: PRVC FiO2 (%):  [50 %-100 %] 50 % Set Rate:  [18 bmp-25 bmp] 18 bmp Vt Set:  [550 mL-600 mL] 550 mL PEEP:  [5 cmH20-7 cmH20] 5 cmH20  INTAKE / OUTPUT: I/O last 3 completed shifts: In: 2961.5 [I.V.:2214.8; NG/GT:146.7; IV Piggyback:600] Out: 105 [Urine:5; Emesis/NG output:100]  PHYSICAL EXAMINATION: General:  Acutely ill AA male in acute distress mechanically intubated Neuro: unresponsive, PERRL HEENT: supple, mild JVD Cardiovascular: nsr s1s2 with right BBB, no M/R/G Lungs:  faint expiratory wheezes throughout, mechanically intubated Abdomen: +BS x4, distended, firm Musculoskeletal: normal bulk and tone no edema  Skin: intact no rashes or lesions   LABS:  BMET  Recent Labs Lab 01/24/17 0500 01/24/17 0704 01/24/17 1053  NA 139 140 138  K 3.9 4.0 4.5  CL 103 102 101  CO2 27 26 26   BUN 31* 31* 33*  CREATININE 2.92* 2.94* 3.01*  GLUCOSE 141* 121* 127*    Electrolytes  Recent Labs Lab Feb 13, 2017 0914  01/24/17 0500 01/24/17 0704 01/24/17 1053  CALCIUM 8.2*  < > 7.4* 7.3* 6.8*  MG 3.7*  --   --   --   --   PHOS 5.2*  --   --   --   --   < > = values in this interval not displayed.  CBC  Recent Labs Lab 13-Feb-2017 0511 Feb 13, 2017 0914 01/24/17 0500  WBC 16.6* 20.5* 10.8*  HGB 14.5 14.6 14.8  HCT 47.9 48.2 46.8  PLT 201 362 154    Coag's  Recent Labs Lab 02/13/17 0914 02/13/2017 1550 01/24/17 0500  APTT 38* 30  --   INR 1.13 1.23 1.34     Sepsis Markers  Recent Labs Lab 02/13/17 0510 Feb 13, 2017 0907 February 13, 2017 0914 01/24/17 0500  LATICACIDVEN 6.6* 3.9*  --   --   PROCALCITON  --   --  1.62 71.48    ABG  Recent Labs Lab 13-Feb-2017 0522 2017/02/13 0742 01/24/17 0234  PHART 7.06* 7.15* 7.24*  PCO2ART 68* 64* 33  PO2ART 84 72* 188*    Liver Enzymes  Recent Labs Lab 02-13-2017 0511 13-Feb-2017 0914 01/24/17 0500  AST 95* 143* 146*  ALT 63 70* 59  ALKPHOS 93 132* 59  BILITOT 0.6 0.6 1.0  ALBUMIN 3.8 3.5 2.7*    Cardiac Enzymes  Recent Labs Lab 2017/02/13 0511 01/24/17 0034  TROPONINI 0.07* 0.97*    Glucose  Recent Labs Lab 01/24/17 0912 01/24/17 0913 01/24/17 0914 01/24/17 0920 01/24/17 0940 01/24/17 1148  GLUCAP 53* 51* 50* 53* 71 124*    Imaging Dg Abd Portable 1v  Result Date: 13-Feb-2017 CLINICAL DATA:  Nasogastric tube placement. EXAM: PORTABLE ABDOMEN - 1 VIEW COMPARISON:  Radiograph of same day. FINDINGS: The bowel gas pattern is normal. No radio-opaque calculi or other significant radiographic abnormality are seen. Nasogastric tube tip is seen in body of  stomach. IMPRESSION: Nasogastric tube tip seen in gastric lumen. No evidence of bowel obstruction or ileus. Electronically Signed   By: Lupita RaiderJames  Green Jr, M.D.   On: 01/24/2017 14:47   Dg Abd Portable 1v  Result Date: 01/31/2017 CLINICAL DATA:  Status post NG tube placement today. EXAM: PORTABLE ABDOMEN - 1 VIEW COMPARISON:  None. FINDINGS: No NG tube is identified. Mild gaseous distention of the stomach noted. IMPRESSION: NG tube is not visualized. Electronically Signed   By: Drusilla Kannerhomas  Dalessio M.D.   On: 01/21/2017 13:42   STUDIES:  CT Head 06/12>>No acute intracranial pathology seen on CT. 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 Partial opacification of the sphenoid sinus  CULTURES: Blood 06/12>>  ANTIBIOTICS: Zosyn 06/12>> Vancomycin 06/12>>  SIGNIFICANT EVENTS: 06/12-Pt admitted to ICU post cardiac  arrest mechanically intubated   LINES/TUBES: ETT 06/12>> CVL 06/12>>  ASSESSMENT / PLAN:  PULMONARY A: Acute hypercapnic respiratory failure secondary to pulmonary edema and possible aspiration  Mechanical intubation s/p cardiac arrest  Hx: Former smoker  Blood gas 7.2/33/78/14.1, consistent with acute metabolic acidosis. P:   On Hypothermic protocol @33  degrees C  Full vent support  Maintain O2 sats >92% Scheduled and prn bronchodilator therapy  Vent Mode: PRVC FiO2 (%):  [50 %-100 %] 50 % Set Rate:  [18 bmp-25 bmp] 18 bmp Vt Set:  [550 mL-600 mL] 550 mL PEEP:  [5 cmH20-7 cmH20] 5 cmH20  CARDIOVASCULAR A:  Vfibb Arrest of unknown etiology may be secondary to respiratory arrest  Mildly elevated troponin s/p cardiac arrest  Hx: Peripheral Vascular Disease, Myocardial Infarction, HTN, CAD, and Hyperlipidemia  Echo 01/24/17: EF equals 20% P:  Cardiology consulted appreciate input Continuous telemetry monitoring  Trend troponin's Echo pending  Amiodarone gtt  EKG per hypothermia protocol   RENAL A:   Acute renal failure with oliguria Hypokalemia  Lactic acidosis Metabolic acidosis  P:   Discussed with nephrology, patient is nonoliguric and likely requires dialysis. However, upon further discussions with neurology and the patient's wife, given his very poor prognosis and lack of responses, the family may decide to withdraw care or make the patient DO NOT RESUSCITATE in 24 hours. Therefore, we will hold off on CRRT at this time  GASTROINTESTINAL A:   Abdominal distention  P:   Orogastric tube @ LIS  Keep NPO for now   HEMATOLOGIC A:   No acute issues  P:  Trend CBC Monitor for s/sx of bleeding  Subq heparin for VTE prophylaxis  Transfuse for hgb <7 PT/INR per hypothermic protocol   INFECTIOUS A:   Possible aspiration pneumonia  Leukocytosis  P:   Trend WBC and monitor fever curve Trend PCT and lactic acid Continue empiric abx Follow cultures    ENDOCRINE A:   Hx: Diabetes Mellitus P:   CBG's q1hr during hypothermia protocol  NEUROLOGIC A:   Acute encephalopathy s/p cardiac arrest P:   RASS goal: -5 for duration of neuromuscular blockade therapy  Shivering assessment BSA goal <1 q1hr during hypothermia protocol Nimbex gtt, Versed gtt, and Fentanyl gtt to maintain RASS goal -5 during cooling process of hypothermia protocol Neurology consulted appreciate input EEG pending No wakeup assessment during hypothermia protocol   FAMILY  - Updates: Pts family updated regarding plan of care and all questions answered informed family pt is very unstable 01/24/2017  - Inter-disciplinary family meet or Palliative Care meeting due by: 01/30/17    Physical Exam   Deep Nicholos Johnsamachandran, MD.   Board Certified in Internal  Medicine, Pulmonary Medicine, Critical Care Medicine, and Sleep Medicine.  [facility] Office Number: [phone] Pager: [phone]  Santiago Glad, M.D.  Billy Fischer, M.D 01/24/2017  Critical Care Attestation.  I have personally obtained a history, examined the patient, evaluated laboratory and imaging results, formulated the assessment and plan and placed orders. The Patient requires high complexity decision making for assessment and support, frequent evaluation and titration of therapies, application of advanced monitoring technologies and extensive interpretation of multiple databases. The patient has critical illness that could lead imminently to failure of 1 or more organ systems and requires the highest level of physician preparedness to intervene.  Critical Care Time devoted to patient care services described in this note is 45 minutes and is exclusive of time spent in procedures supervisory time of NP.

## 2017-01-24 NOTE — Progress Notes (Addendum)
[facility] - [place] at [facility]   PATIENT NAME: Wayne [name]    MR#:  [id number]  DATE OF BIRTH:  04/26/1956  SUBJECTIVE: Admitted for cardiac arrest, code ICE protocol is being followed. output since yesterday/. EG is done.   CHIEF COMPLAINT:   Chief Complaint  Patient presents with  . CPR    REVIEW OF SYSTEMS:   Review of Systems  Unable to perform ROS: Acuity of condition    DRUG ALLERGIES:  No Known Allergies  VITALS:  Blood pressure 95/83, pulse 80, temperature (!) 91.2 F (32.9 C), temperature source Core (Comment), resp. rate 18, height 5\' 7"  (1.702 m), weight 98.1 kg (216 lb 4.3 oz), SpO2 97 %.  PHYSICAL EXAMINATION:  GENERAL:  61 y.o.-year-old patient lying in the bed , intubated, EYES: "But not reacting to light. HEENT: Head atraumatic, normocephalic. Intubated.  NECK:  Supple, no jugular venous distention. No thyroid enlargement, no tenderness.  LUNGS: Normal breath sounds bilaterally, patient has .Coarsebreathsounds.  CARDIOVASCULAR: S1, S2 normal. No murmurs, rubs, or gallops.  ABDOMEN: Soft, , nondistended. Bowel sounds present. No organomegaly or mass.  EXTREMITIES: No pedal edema, cyanosis, or clubbing.  NEUROLOGIC: Intubated, status post cardiac arrest. Patient not on any neuromuscular blocking agents were still unresponsive. PSYCHIATRIC: intubated/sedated.  LABORATORY PANEL:   CBC  Recent Labs Lab 01/24/17 0500  WBC 10.8*  HGB 14.8  HCT 46.8  PLT 154   ------------------------------------------------------------------------------------------------------------------  Chemistries   Recent Labs Lab Feb 11, 2017 0914  01/24/17 0500  01/24/17 1053  NA 137  < > 139  < > 138  K 5.5*  < > 3.9  < > 4.5  CL 104  < > 103  < > 101  CO2 24  < > 27  < > 26  GLUCOSE 260*  < > 141*  < > 127*  BUN 18  < > 31*  < > 33*  CREATININE 1.86*  < > 2.92*  < > 3.01*  CALCIUM 8.2*  < > 7.4*  < > 6.8*  MG 3.7*  --   --   --   --   AST  143*  --  146*  --   --   ALT 70*  --  59  --   --   ALKPHOS 132*  --  59  --   --   BILITOT 0.6  --  1.0  --   --   < > = values in this interval not displayed. ------------------------------------------------------------------------------------------------------------------  Cardiac Enzymes  Recent Labs Lab 01/24/17 0034  TROPONINI 0.97*   ------------------------------------------------------------------------------------------------------------------  RADIOLOGY:  Ct Head Wo Contrast  Result Date: 11-Feb-2017 CLINICAL DATA:  Status post resuscitation. Patient found pulseless. Initial encounter. EXAM: CT HEAD WITHOUT CONTRAST TECHNIQUE: Contiguous axial images were obtained from the base of the skull through the vertex without intravenous contrast. COMPARISON:  None. FINDINGS: Brain: No evidence of acute infarction, hemorrhage, hydrocephalus, extra-axial collection or mass lesion/mass effect. Small chronic lacunar infarcts are seen at the basal ganglia bilaterally. The posterior fossa, including the cerebellum, brainstem and fourth ventricle, is within normal limits. The third and lateral ventricles are unremarkable in appearance. The cerebral hemispheres are symmetric in appearance, with normal gray-white differentiation. No mass effect or midline shift is seen. Vascular: No hyperdense vessel or unexpected calcification. Skull: There is no evidence of fracture; visualized osseous structures are unremarkable in appearance. Sinuses/Orbits: The visualized portions of the orbits are within normal limits. There is partial opacification of the sphenoid  sinus with fluid. The remaining paranasal sinuses and mastoid air cells are well-aerated. Other: No significant soft tissue abnormalities are seen. IMPRESSION: 1. No acute intracranial pathology seen on CT. 2. 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 3. Partial opacification of the sphenoid sinus. Electronically Signed   By:  Roanna RaiderJeffery  Chang M.D.   On: 01/15/2017 06:29   Dg Chest Port 1 View  Result Date: 02/04/2017 CLINICAL DATA:  Central line placement, recent cardiac arrest, intubated patient. EXAM: PORTABLE CHEST 1 VIEW COMPARISON:  Portable chest x-ray of January 23, 2017 at 5:11 a.m. FINDINGS: The patient has undergone placement of a left internal jugular venous catheter. The tip projects over the midportion of the SVC. There is no postprocedure pneumothorax. The interstitial markings remain increased bilaterally greatest in the upper lobes where area of confluent density on the right has developed. The cardiac silhouette remains enlarged. The pulmonary vascularity is engorged and indistinct. External pacemaker defibrillator pads are present. The endotracheal tube tip lies approximately 8.8 cm above the carina. The tip is at this. Our margin of the clavicular heads. IMPRESSION: No postprocedure complication following left internal jugular venous catheter placement. High positioning of the endotracheal tube. Advancement by 4 cm is recommended. CHF with mild interstitial edema. Developing alveolar opacity in the right upper lobe may reflect pulmonary edema or pneumonia. Electronically Signed   By: David  Swazil[place]Jordan M.D.   On: 01/24/2017 08:19   Dg Chest Portable 1 View  Result Date: 01/26/2017 CLINICAL DATA:  Endotracheal tube placement. Status post resuscitation. Initial encounter. EXAM: PORTABLE CHEST 1 VIEW COMPARISON:  None. FINDINGS: The patient's endotracheal tube is seen ending 10 cm above the carina. This could be advanced 6 cm. Vascular congestion is noted. Increased interstitial markings raise concern for mild pulmonary edema. No pleural effusion or pneumothorax is seen. The cardiomediastinal silhouette is enlarged. External pacing pads are noted. No acute osseous abnormalities are seen. IMPRESSION: 1. Endotracheal tube seen ending 10 cm above the carina. This could be advanced 6 cm, as deemed clinically appropriate. 2.  Vascular congestion and cardiomegaly. Increased interstitial markings raise concern for mild pulmonary edema. Electronically Signed   By: Roanna RaiderJeffery  Chang M.D.   On: 01/28/2017 05:36   Dg Abd Portable 1v  Result Date: 01/14/2017 CLINICAL DATA:  Nasogastric tube placement. EXAM: PORTABLE ABDOMEN - 1 VIEW COMPARISON:  Radiograph of same day. FINDINGS: The bowel gas pattern is normal. No radio-opaque calculi or other significant radiographic abnormality are seen. Nasogastric tube tip is seen in body of stomach. IMPRESSION: Nasogastric tube tip seen in gastric lumen. No evidence of bowel obstruction or ileus. Electronically Signed   By: Lupita RaiderJames  Green Jr, M.D.   On: 01/29/2017 14:47   Dg Abd Portable 1v  Result Date: 01/21/2017 CLINICAL DATA:  Status post NG tube placement today. EXAM: PORTABLE ABDOMEN - 1 VIEW COMPARISON:  None. FINDINGS: No NG tube is identified. Mild gaseous distention of the stomach noted. IMPRESSION: NG tube is not visualized. Electronically Signed   By: Drusilla Kannerhomas  Dalessio M.D.   On: 02/05/2017 13:42    EKG:   Orders placed or performed during the hospital encounter of 01/29/2017  . ED EKG  . ED EKG  . EKG 12-Lead  . EKG 12-Lead  . EKG 12-Lead  . EKG 12-Lead  . EKG 12-Lead  . EKG 12-Lead  . EKG 12-Lead  . EKG 12-Lead  . EKG 12-Lead  . EKG 12-Lead    ASSESSMENT AND PLAN:   #61 year old male patient  with history of coronary artery disease, diabetes mellitus type 2, hyperlipidemia, hypertension, PVD found by wife unresponsive, patient had CPR, ACLS, 5 rounds of therapy, amiodarone, patient was in V. fib so he got shocked 3 times  and admitted to intensive care unit, code  ICE protocol followed. Patient is on hypothermia protocol currently at 35 cooled down to 33. #2 cardiac arrest ;f/u echo' prognosis really poor, #3 acute renal failure with metabolic acidosis, baseline creatinine 1.2, patient has no urine output since yesterday, worsening renal function. And the patient is  CRRT, according to wife she wants to wait for another 24 hours before making decision.  # cardiogenic shock./, V. fib. Continue amiodarone drip, requiring pressors.   Prognosis really really poor, patient should be on comfort care.  All the records are reviewed and case discussed with Care Management/Social Workerr. Management plans discussed with the patient, family and they are in agreement.  CODE STATUS: Full code  TOTAL TIME TAKING CARE OF THIS PATIENT: .    Katha Hamming M.D on 01/24/2017 at 12:42 PM  Between 7am to 6pm - Pager - [phone]  After 6pm go to [email or web address] - password [email or web address] [facility]  [place] [facility]  Office  [phone]  CC: Primary care physician; Marisue Ivan, MD   Note: This dictation was prepared with Dragon dictation along with smaller phrase technology. Any transcriptional errors that result from this process are unintentional.

## 2017-01-24 NOTE — Progress Notes (Signed)
eLink Physician-Brief Progress Note Patient Name: Wayne [name] DOB: May 16, 1956 MRN: [id number]   Date of Service  01/24/2017  HPI/Events of Note  2-3 beat run of NSVT. Last K+ =  At 3:23 PM.   eICU Interventions  Will order: 1. Magnesium level STAT.     Intervention Category Major Interventions: Arrhythmia - evaluation and management  Sommer,Steven Eugene 01/24/2017, 5:56 PM

## 2017-01-24 NOTE — Progress Notes (Signed)
*  PRELIMINARY RESULTS* Echocardiogram 2D Echocardiogram has been performed.  Cristela BlueHege, Janette Harvie 01/24/2017, 11:42 AM

## 2017-01-24 NOTE — Progress Notes (Signed)
Nutrition Follow-up  DOCUMENTATION CODES:   Obesity unspecified  INTERVENTION:  1. Vomited yesterday evening, tubefeeds held, recommend re-start TFs @ 6120mL/hr tomorrow following rewarming  NUTRITION DIAGNOSIS:   Inadequate oral intake related to inability to eat as evidenced by NPO status. -ongoing  GOAL:   Provide needs based on ASPEN/SCCM guidelines -not meeting  MONITOR:   Skin, TF tolerance, I & O's, Labs, Vent status  REASON FOR ASSESSMENT:   Ventilator    ASSESSMENT:   61 year old male patient with history of coronary artery disease, diabetes mellitus and peripheral vascular disease, hyperlipidemia, hypertension was brought to the emergency room status post cardiac arrest after resuscitation.  Patient is currently intubated on ventilator support MV: 10 L/min Temp (24hrs), Avg:91.6 F (33.1 C), Min:89.4 F (31.9 C), Max:95.4 F (35.2 C) Propofol: none Discussed in Rounds Still no BM, no history of most recent BM per family  Labs and medications reviewed: BUN/Creatinine 33/3.01, Phos 5.2, Mg 3.7, Phos 5.2 Amiodarone gtt, NImbex gtt, Fentanyl gtt, Versed gtt, Levo gtt, Vaso gtt NaHCO3 w/ d5 @ 6575mL/hr --> 306 calories  Diet Order:  Diet NPO time specified  Skin:  Reviewed, no issues  Last BM:  PTA  Height:   Ht Readings from Last 1 Encounters:  01/28/17 5\' 7"  (1.702 m)    Weight:   Wt Readings from Last 1 Encounters:  01/24/17 216 lb 4.3 oz (98.1 kg)    Ideal Body Weight:  70 kg  BMI:  Body mass index is 33.87 kg/m.  Estimated Nutritional Needs:   Kcal:  1046 - 1332 calories  Protein:  >/= 140 grams  Fluid:  Per MD/NP/PA  EDUCATION NEEDS:   Education needs no appropriate at this time  Dionne AnoWilliam M. Darl Brisbin, MS, RD LDN Inpatient Clinical Dietitian Pager [phone]

## 2017-01-24 NOTE — Progress Notes (Signed)
Chaplain was walking rounds and just step into ICU waiting room and talked To patient family and gave Spiritual support.

## 2017-01-24 NOTE — Progress Notes (Addendum)
ELink notified of 2 short runs VTach since rewarming initiated, K slowly increasing, 5.1 at last draw.  Magda Paganiniudrey will notify MD Summer

## 2017-01-24 NOTE — Care Management (Signed)
RNCM met with patient's wife at patient's bedside and explained RNCM role. RNCM shared contact number in case she has any questions or concerns. RNCM will continue to follow.

## 2017-01-24 NOTE — Consult Note (Signed)
Reason for Consult: cardiac arrest  Referring Physician: Dr. Luberta Mutter   CC: cardiac arrest   HPI: Wayne [name] is an 61 y.o. male with a known history of Coronary artery disease, diabetes mellitus, hyperlipidemia, hypertension, peripheral neuropathy, peripheral vascular disease was found by wife unresponsive early this morning. Patient was found unresponsive and EMS was called. Full ACLS protocol was ran patient was given 5 rounds of epinephrine, 3 amiodarone and patient was found to be in ventricular fibrillation and was shocked 3 times en route to the hospital. Currently intubated, sedated with fentanyl, versed.  Initial CTH no acute abnormalities.   Still on hypothermia protocol.    Past Medical History:  Diagnosis Date  . Coronary artery disease   . Diabetes mellitus without complication (HCC)   . Hyperlipidemia   . Hypertension   . Myocardial infarction (HCC) 1996  . Peripheral neuropathy   . PVD (peripheral vascular disease) (HCC)     Past Surgical History:  Procedure Laterality Date  . Aortobifemoral bypass    . PERIPHERAL VASCULAR CATHETERIZATION N/A 02/01/2015   Procedure: Abdominal Aortogram w/Lower Extremity;  Surgeon: Annice Needy, MD;  Location: [facility] INVASIVE CV LAB;  Service: Cardiovascular;  Laterality: N/A;  . PERIPHERAL VASCULAR CATHETERIZATION  02/01/2015   Procedure: Lower Extremity Intervention;  Surgeon: Annice Needy, MD;  Location: [facility] INVASIVE CV LAB;  Service: Cardiovascular;;  . Right leg stents    . VASCULAR SURGERY    . WOUND DEBRIDEMENT Right 12/30/2014   Procedure: DEBRIDEMENT WOUND/RIGHT CALF I&D WITH WOUND VAC;  Surgeon: Annice Needy, MD;  Location: [facility] ORS;  Service: Vascular;  Laterality: Right;    Family History  Problem Relation Age of Onset  . Hypertension Mother   . Hypertension Father   . Hypertension Brother     Social History:  reports that he quit smoking about 10 years ago. He has a 40.00 pack-year smoking history. He has never used  smokeless tobacco. He reports that he does not drink alcohol or use drugs.  No Known Allergies  Medications: I have reviewed the patient's current medications.  ROS: Unable to obtain   Physical Examination: Blood pressure 95/83, pulse 80, temperature (!) 91.2 F (32.9 C), temperature source Core (Comment), resp. rate 18, height 5\' 7"  (1.702 m), weight 98.1 kg (216 lb 4.3 oz), SpO2 97 %.  No brainstem reflexes at this time, but pt is on sedation No corneals, no pupils No cough/gag Pupils 1mm not reactive.    Laboratory Studies:   Basic Metabolic Panel:  Recent Labs Lab 01/22/2017 0914  01/31/2017 2304 01/24/17 0336 01/24/17 0500 01/24/17 0704 01/24/17 1053  NA 137  < > 137 141 139 140 138  K 5.5*  < > 3.8 3.8 3.9 4.0 4.5  CL 104  < > 106 104 103 102 101  CO2 24  < > 21* 28 27 26 26   GLUCOSE 260*  < > 163* 151* 141* 121* 127*  BUN 18  < > 27* 29* 31* 31* 33*  CREATININE 1.86*  < > 2.66* 2.92* 2.92* 2.94* 3.01*  CALCIUM 8.2*  < > 8.0* 7.4* 7.4* 7.3* 6.8*  MG 3.7*  --   --   --   --   --   --   PHOS 5.2*  --   --   --   --   --   --   < > = values in this interval not displayed.  Liver Function Tests:  Recent Labs Lab 01/19/2017 [id number]  01/20/2017 0914 01/24/17 0500  AST 95* 143* 146*  ALT 63 70* 59  ALKPHOS 93 132* 59  BILITOT 0.6 0.6 1.0  PROT 7.0 6.6 5.2*  ALBUMIN 3.8 3.5 2.7*   No results for input(s): LIPASE, AMYLASE in the last 168 hours. No results for input(s): AMMONIA in the last 168 hours.  CBC:  Recent Labs Lab 02/05/2017 0511 01/16/2017 0914 01/24/17 0500  WBC 16.6* 20.5* 10.8*  NEUTROABS 8.3*  --   --   HGB 14.5 14.6 14.8  HCT 47.9 48.2 46.8  MCV 84.4 81.5 80.7  PLT 201 362 154    Cardiac Enzymes:  Recent Labs Lab 02/03/2017 0511 01/24/17 0034  TROPONINI 0.07* 0.97*    BNP: Invalid input(s): POCBNP  CBG:  Recent Labs Lab 01/24/17 0913 01/24/17 0914 01/24/17 0920 01/24/17 0940 01/24/17 1148  GLUCAP 51* 50* 53* 71 124*     Microbiology: Results for orders placed or performed during the hospital encounter of 01/22/2017  Blood culture (routine x 2)     Status: None (Preliminary result)   Collection Time: 01/20/2017  5:10 AM  Result Value Ref Range Status   Specimen Description BLOOD LAC  Final   Special Requests   Final    BOTTLES DRAWN AEROBIC AND ANAEROBIC Blood Culture adequate volume   Culture NO GROWTH 1 DAY  Final   Report Status PENDING  Incomplete  Blood culture (routine x 2)     Status: None (Preliminary result)   Collection Time: 01/12/2017  5:22 AM  Result Value Ref Range Status   Specimen Description BLOOD RAC  Final   Special Requests   Final    BOTTLES DRAWN AEROBIC AND ANAEROBIC Blood Culture adequate volume   Culture NO GROWTH 1 DAY  Final   Report Status PENDING  Incomplete  Culture, respiratory (NON-Expectorated)     Status: None (Preliminary result)   Collection Time: 02/08/2017  9:07 AM  Result Value Ref Range Status   Specimen Description TRACHEAL ASPIRATE  Final   Special Requests NONE  Final   Gram Stain   Final    RARE WBC PRESENT,BOTH PMN AND MONONUCLEAR RARE GRAM POSITIVE COCCI RARE GRAM NEGATIVE RODS    Culture   Final    CULTURE REINCUBATED FOR BETTER GROWTH Performed at [facility], [address] [place], [place] [address]    Report Status PENDING  Incomplete  MRSA PCR Screening     Status: None   Collection Time: 01/31/2017  1:00 PM  Result Value Ref Range Status   MRSA by PCR NEGATIVE NEGATIVE Final    Comment:        The GeneXpert MRSA Assay (FDA approved for NASAL specimens only), is one component of a comprehensive MRSA colonization surveillance program. It is not intended to diagnose MRSA infection nor to guide or monitor treatment for MRSA infections.     Coagulation Studies:  Recent Labs  02/03/2017 0914 02/08/2017 1550 01/24/17 0500  LABPROT 14.6 15.6* 16.7*  INR 1.13 1.23 1.34    Urinalysis: No results for input(s): COLORURINE, LABSPEC,  PHURINE, GLUCOSEU, HGBUR, BILIRUBINUR, KETONESUR, PROTEINUR, UROBILINOGEN, NITRITE, LEUKOCYTESUR in the last 168 hours.  Invalid input(s): APPERANCEUR  Lipid Panel:  No results found for: CHOL, TRIG, HDL, CHOLHDL, VLDL, LDLCALC  HgbA1C:  Lab Results  Component Value Date   HGBA1C 5.6 12/05/2014    Urine Drug Screen:  No results found for: LABOPIA, COCAINSCRNUR, LABBENZ, AMPHETMU, THCU, LABBARB  Alcohol Level: No results for input(s): ETH in the  last 168 hours.  Other results: EKG: normal EKG, normal sinus rhythm, unchanged from previous tracings.  Imaging: Ct Head Wo Contrast  Result Date: 17-Apr-2017 CLINICAL DATA:  Status post resuscitation. Patient found pulseless. Initial encounter. EXAM: CT HEAD WITHOUT CONTRAST TECHNIQUE: Contiguous axial images were obtained from the base of the skull through the vertex without intravenous contrast. COMPARISON:  None. FINDINGS: Brain: No evidence of acute infarction, hemorrhage, hydrocephalus, extra-axial collection or mass lesion/mass effect. Small chronic lacunar infarcts are seen at the basal ganglia bilaterally. The posterior fossa, including the cerebellum, brainstem and fourth ventricle, is within normal limits. The third and lateral ventricles are unremarkable in appearance. The cerebral hemispheres are symmetric in appearance, with normal gray-white differentiation. No mass effect or midline shift is seen. Vascular: No hyperdense vessel or unexpected calcification. Skull: There is no evidence of fracture; visualized osseous structures are unremarkable in appearance. Sinuses/Orbits: The visualized portions of the orbits are within normal limits. There is partial opacification of the sphenoid sinus with fluid. The remaining paranasal sinuses and mastoid air cells are well-aerated. Other: No significant soft tissue abnormalities are seen. IMPRESSION: 1. No acute intracranial pathology seen on CT. 2. Small chronic lacunar infarcts at the basal ganglia  bilaterally. 3. Partial opacification of the sphenoid sinus. Electronically Signed   By: Roanna RaiderJeffery  Chang M.D.   On: 004-Sep-2018 06:29   Dg Chest Port 1 View  Result Date: 17-Apr-2017 CLINICAL DATA:  Central line placement, recent cardiac arrest, intubated patient. EXAM: PORTABLE CHEST 1 VIEW COMPARISON:  Portable chest x-ray of January 23, 2017 at 5:11 a.m. FINDINGS: The patient has undergone placement of a left internal jugular venous catheter. The tip projects over the midportion of the SVC. There is no postprocedure pneumothorax. The interstitial markings remain increased bilaterally greatest in the upper lobes where area of confluent density on the right has developed. The cardiac silhouette remains enlarged. The pulmonary vascularity is engorged and indistinct. External pacemaker defibrillator pads are present. The endotracheal tube tip lies approximately 8.8 cm above the carina. The tip is at this. Our margin of the clavicular heads. IMPRESSION: No postprocedure complication following left internal jugular venous catheter placement. High positioning of the endotracheal tube. Advancement by 4 cm is recommended. CHF with mild interstitial edema. Developing alveolar opacity in the right upper lobe may reflect pulmonary edema or pneumonia. Electronically Signed   By: David  Swazil[place]Jordan M.D.   On: 004-Sep-2018 08:19   Dg Chest Portable 1 View  Result Date: 17-Apr-2017 CLINICAL DATA:  Endotracheal tube placement. Status post resuscitation. Initial encounter. EXAM: PORTABLE CHEST 1 VIEW COMPARISON:  None. FINDINGS: The patient's endotracheal tube is seen ending 10 cm above the carina. This could be advanced 6 cm. Vascular congestion is noted. Increased interstitial markings raise concern for mild pulmonary edema. No pleural effusion or pneumothorax is seen. The cardiomediastinal silhouette is enlarged. External pacing pads are noted. No acute osseous abnormalities are seen. IMPRESSION: 1. Endotracheal tube seen ending 10  cm above the carina. This could be advanced 6 cm, as deemed clinically appropriate. 2. Vascular congestion and cardiomegaly. Increased interstitial markings raise concern for mild pulmonary edema. Electronically Signed   By: Roanna RaiderJeffery  Chang M.D.   On: 004-Sep-2018 05:36   Dg Abd Portable 1v  Result Date: 17-Apr-2017 CLINICAL DATA:  Nasogastric tube placement. EXAM: PORTABLE ABDOMEN - 1 VIEW COMPARISON:  Radiograph of same day. FINDINGS: The bowel gas pattern is normal. No radio-opaque calculi or other significant radiographic abnormality are seen. Nasogastric tube tip is seen  in body of stomach. IMPRESSION: Nasogastric tube tip seen in gastric lumen. No evidence of bowel obstruction or ileus. Electronically Signed   By: Lupita Raider, M.D.   On: 01/19/2017 14:47   Dg Abd Portable 1v  Result Date: 01/26/2017 CLINICAL DATA:  Status post NG tube placement today. EXAM: PORTABLE ABDOMEN - 1 VIEW COMPARISON:  None. FINDINGS: No NG tube is identified. Mild gaseous distention of the stomach noted. IMPRESSION: NG tube is not visualized. Electronically Signed   By: Drusilla Kanner M.D.   On: 01/14/2017 13:42     Assessment/Plan:  61 y.o. male with a known history of Coronary artery disease, diabetes mellitus, hyperlipidemia, hypertension, peripheral neuropathy, peripheral vascular disease was found by wife unresponsive early this morning. Patient was found unresponsive and EMS was called. Full ACLS protocol was ran patient was given 5 rounds of epinephrine, 3 amiodarone and patient was found to be in ventricular fibrillation and was shocked 3 times en route to the hospital. Currently intubated, sedated with fentanyl, versed.  Initial CTH no acute abnormalities.   On examination  Pt does not have any brainstem reflexes but he is not warmed up yet discussed with pt's family at bedside, discussed goals of care and code status.  For now pt will be full Code but she is thinking of making him DNR.   - EEG prelim  no seizures -  Will need repeat imaging.  01/24/2017, 1:01 PM

## 2017-01-24 NOTE — Consult Note (Signed)
[facility]  CONSULT NOTE    Date: 01/24/2017                  Patient Name:  Wayne [name]  MRN: [id number]  DOB: 1956-05-31  Age / Sex: 61 y.o., male         PCP: Dion Body, MD                 Service Requesting Consult: Dr. Ashby Dawes                 Reason for Consult: Acute renal failure            History of Present Illness: [name] is a 61 y.o. black male with morbid obesity, hyperlipidemia, gout, hypertension, diabetes mellitus type II, peripheral vascular disease, coronary artery disease, who was admitted to [facility] on 01/26/2017 for Cardiac arrest [facility]) [I46.9] Cardiopulmonary arrest ([place]) [I46.9] Lactic acidosis [E87.2] Respiratory acidosis [E87.2] Acute pulmonary edema ([place]) [J81.0]  Patient placed on cryotherapy for acute coronary syndrome. Creatinine on admission of 1.62 and progressed to 3.01 with anuric urine output.    Medications: Outpatient medications: Prescriptions Prior to Admission  Medication Sig Dispense Refill Last Dose  . allopurinol (ZYLOPRIM) 100 MG tablet Take 200 mg by mouth daily.    Past Week at Unknown time  . atorvastatin (LIPITOR) 40 MG tablet Take 40 mg by mouth at bedtime.  1 Past Week at Unknown time  . carvedilol (COREG) 6.25 MG tablet Take 6.25 mg by mouth 2 (two) times daily with a meal.    Past Week at Unknown time  . clopidogrel (PLAVIX) 75 MG tablet Take 75 mg by mouth daily.  3 Past Week at Unknown time  . furosemide (LASIX) 20 MG tablet Take 20 mg by mouth daily.  1 Past Week at Unknown time  . gabapentin (NEURONTIN) 300 MG capsule Take 300 mg by mouth 2 (two) times daily.  1 Past Week at Unknown time  . lisinopril (PRINIVIL,ZESTRIL) 20 MG tablet Take 20 mg by mouth every morning.   Past Week at Unknown time  . metFORMIN (GLUCOPHAGE) 500 MG tablet Take 500 mg by mouth at bedtime.   Past Week at Unknown time  . aspirin 81 MG tablet Take 81 mg by mouth daily.   Not Taking at Unknown time    Current  medications: Current Facility-Administered Medications  Medication Dose Route Frequency Provider Last Rate Last Dose  . acetaminophen (TYLENOL) tablet 650 mg  650 mg Oral Q6H PRN Saundra Shelling, MD       Or  . acetaminophen (TYLENOL) suppository 650 mg  650 mg Rectal Q6H PRN Pyreddy, Pavan, MD      . albuterol (PROVENTIL) (2.5 MG/3ML) 0.083% nebulizer solution 2.5 mg  2.5 mg Nebulization Q6H Laverle Hobby, MD   2.5 mg at 01/24/17 0744  . amiodarone (NEXTERONE PREMIX) 360-4.14 MG/200ML-% (1.8 mg/mL) IV infusion  30 mg/hr Intravenous Continuous Awilda Bill, NP 16.7 mL/hr at 01/24/17 1100 30 mg/hr at 01/24/17 1100  . chlorhexidine gluconate (MEDLINE KIT) (PERIDEX) 0.12 % solution 15 mL  15 mL Mouth Rinse BID Laverle Hobby, MD      . cisatracurium (NIMBEX) bolus via infusion 9.5 mg  0.1 mg/kg Intravenous Once Awilda Bill, NP       And  . cisatracurium (NIMBEX) 200 mg in sodium chloride 0.9 % 200 mL (1 mg/mL) infusion  1-1.5 mcg/kg/min Intravenous Continuous Awilda Bill, NP  And  . cisatracurium (NIMBEX) bolus via infusion 4.8 mg  0.05 mg/kg Intravenous PRN Awilda Bill, NP      . feeding supplement (PRO-STAT SUGAR FREE 64) liquid 30 mL  30 mL Per Tube TID Laverle Hobby, MD   30 mL at 01/14/2017 1605  . feeding supplement (VITAL HIGH PROTEIN) liquid 1,000 mL  1,000 mL Per Tube Q24H Laverle Hobby, MD   Stopped at 02/10/2017 1739  . fentaNYL (SUBLIMAZE) bolus via infusion 50 mcg  50 mcg Intravenous Q30 min PRN Awilda Bill, NP      . fentaNYL 2550mg in NS 2541m(1062mml) infusion-PREMIX  100-300 mcg/hr Intravenous Continuous KonEpifanio LeschesD 5 mL/hr at 01/24/17 1100 50 mcg/hr at 01/24/17 1100  . heparin injection 5,000 Units  5,000 Units Subcutaneous Q8H BlaAwilda BillP   5,000 Units at 01/24/17 0602  . insulin aspart (novoLOG) injection 2-6 Units  2-6 Units Subcutaneous Q4H RamLaverle HobbyD      . MEDLINE mouth rinse  15 mL  Mouth Rinse 10 times per day RamLaverle HobbyD   15 mL at 01/24/17 0926  . midazolam (VERSED) 50 mg in sodium chloride 0.9 % 50 mL (1 mg/mL) infusion  2-10 mg/hr Intravenous Continuous BlaAwilda BillP 2 mL/hr at 01/24/17 1100 2 mg/hr at 01/24/17 1100  . midazolam (VERSED) bolus via infusion 2 mg  2 mg Intravenous Q30 min PRN BlaAwilda BillP      . midazolam (VERSED) injection 2 mg  2 mg Intravenous Once BlaAwilda BillP      . norepinephrine (LEVOPHED) 16 mg in dextrose 5 % 250 mL (0.064 mg/mL) infusion  0-50 mcg/min Intravenous Titrated BlaAwilda BillP 23.4 mL/hr at 01/24/17 1100 24.96 mcg/min at 01/24/17 1100  . ondansetron (ZOFRAN) tablet 4 mg  4 mg Oral Q6H PRN Pyreddy, PavReatha HarpsD       Or  . ondansetron (ZOFRAN) injection 4 mg  4 mg Intravenous Q6H PRN Pyreddy, Pavan, MD      . pantoprazole (PROTONIX) injection 40 mg  40 mg Intravenous Daily Pyreddy, PavReatha HarpsD   40 mg at 01/24/17 0926  . piperacillin-tazobactam (ZOSYN) IVPB 3.375 g  3.375 g Intravenous Q8H KonEpifanio LeschesD   Stopped at 01/24/17 1034  . polyvinyl alcohol (LIQUIFILM TEARS) 1.4 % ophthalmic solution 1 drop  1 drop Both Eyes Q8H KonEpifanio LeschesD   1 drop at 01/24/17 0634  . sodium bicarbonate 150 mEq in dextrose 5 % 1,000 mL infusion   Intravenous Continuous RamLaverle HobbyD 75 mL/hr at 01/24/17 1100    . sodium chloride 0.9 % bolus 1,000 mL  1,000 mL Intravenous Once Pyreddy, PavReatha HarpsD       And  . sodium chloride 0.9 % bolus 1,000 mL  1,000 mL Intravenous Once Pyreddy, Pavan, MD       And  . sodium chloride 0.9 % bolus 1,000 mL  1,000 mL Intravenous Once Pyreddy, Pavan, MD      . sodium chloride flush (NS) 0.9 % injection 10-40 mL  10-40 mL Intracatheter PRN KonEpifanio LeschesD      . vancomycin (VANCOCIN) 1,250 mg in sodium chloride 0.9 % 250 mL IVPB  1,250 mg Intravenous Q24H KonEpifanio LeschesD   Stopped at 01/20/2017 1501  . vasopressin (PITRESSIN) 40 Units in  sodium chloride 0.9 % 250 mL (0.16 Units/mL) infusion  0.03 Units/min Intravenous Continuous BlaAwilda BillP 11.3 mL/hr at 01/24/17 1100 0.03 Units/min  at 01/24/17 1100      Allergies: No Known Allergies    Past Medical History: Past Medical History:  Diagnosis Date  . Coronary artery disease   . Diabetes mellitus without complication ([place])   . Hyperlipidemia   . Hypertension   . Myocardial infarction ([place]) 1996  . Peripheral neuropathy   . PVD (peripheral vascular disease) ([place])      Past Surgical History: Past Surgical History:  Procedure Laterality Date  . Aortobifemoral bypass    . PERIPHERAL VASCULAR CATHETERIZATION N/A 02/01/2015   Procedure: Abdominal Aortogram w/Lower Extremity;  Surgeon: Algernon Huxley, MD;  Location: [facility];  Service: Cardiovascular;  Laterality: N/A;  . PERIPHERAL VASCULAR CATHETERIZATION  02/01/2015   Procedure: Lower Extremity Intervention;  Surgeon: Algernon Huxley, MD;  Location: [facility];  Service: Cardiovascular;;  . Right leg stents    . VASCULAR SURGERY    . WOUND DEBRIDEMENT Right 12/30/2014   Procedure: DEBRIDEMENT WOUND/RIGHT CALF I&D WITH WOUND VAC;  Surgeon: Algernon Huxley, MD;  Location: [facility];  Service: Vascular;  Laterality: Right;     Family History: Family History  Problem Relation Age of Onset  . Hypertension Mother   . Hypertension Father   . Hypertension Brother      Social History: Social History   Social History  . Marital status: Married    Spouse name: N/A  . Number of children: N/A  . Years of education: N/A   Occupational History  . Not on file.   Social History Main Topics  . Smoking status: Former Smoker    Packs/day: 2.00    Years: 20.00    Quit date: 12/28/2006  . Smokeless tobacco: Never Used  . Alcohol use No  . Drug use: No  . Sexual activity: Not on file   Other Topics Concern  . Not on file   Social History Narrative  . No narrative on file     Review of  Systems: Review of Systems  Unable to perform ROS: Intubated    Vital Signs: Blood pressure 95/83, pulse 80, temperature (!) 91.8 F (33.2 C), temperature source Core (Comment), resp. rate 18, height 5' 7"  (1.702 m), weight 98.1 kg (216 lb 4.3 oz), SpO2 94 %.  Weight trends: Filed Weights   01/24/2017 0509 01/28/2017 0800 01/24/17 0447  Weight: 95.1 kg (209 lb 11.2 oz) 100.5 kg (221 lb 9 oz) 98.1 kg (216 lb 4.3 oz)    Physical Exam: General: Critically ill   Head: ETT  Eyes: Eyes closed  Neck: Supple, trachea midline  Lungs:  PRVC 50%  Heart: Regular rate and rhythm  Abdomen:  Soft, nontender, obese  Extremities: no peripheral edema.  Neurologic: Intubated, sedated  Skin: No lesions  Access: none     Lab results: Basic Metabolic Panel:  Recent Labs Lab 02/01/2017 0914  01/24/17 0500 01/24/17 0704 01/24/17 1053  NA 137  < > 139 140 138  K 5.5*  < > 3.9 4.0 4.5  CL 104  < > 103 102 101  CO2 24  < > 27 26 26   GLUCOSE 260*  < > 141* 121* 127*  BUN 18  < > 31* 31* 33*  CREATININE 1.86*  < > 2.92* 2.94* 3.01*  CALCIUM 8.2*  < > 7.4* 7.3* 6.8*  MG 3.7*  --   --   --   --   PHOS 5.2*  --   --   --   --   < > =  values in this interval not displayed.  Liver Function Tests:  Recent Labs Lab 02/10/2017 0511 01/12/2017 0914 01/24/17 0500  AST 95* 143* 146*  ALT 63 70* 59  ALKPHOS 93 132* 59  BILITOT 0.6 0.6 1.0  PROT 7.0 6.6 5.2*  ALBUMIN 3.8 3.5 2.7*   No results for input(s): LIPASE, AMYLASE in the last 168 hours. No results for input(s): AMMONIA in the last 168 hours.  CBC:  Recent Labs Lab 01/30/2017 0511 01/16/2017 0914 01/24/17 0500  WBC 16.6* 20.5* 10.8*  NEUTROABS 8.3*  --   --   HGB 14.5 14.6 14.8  HCT 47.9 48.2 46.8  MCV 84.4 81.5 80.7  PLT 201 362 154    Cardiac Enzymes:  Recent Labs Lab 02/07/2017 0511 01/24/17 0034  TROPONINI 0.07* 0.97*    BNP: Invalid input(s): POCBNP  CBG:  Recent Labs Lab 01/24/17 0010 01/24/17 0109  01/24/17 0211 01/24/17 0307 01/24/17 0413  GLUCAP 158* 170* 170* 151* 134*    Microbiology: Results for orders placed or performed during the hospital encounter of 02/08/2017  Blood culture (routine x 2)     Status: None (Preliminary result)   Collection Time: 01/17/2017  5:10 AM  Result Value Ref Range Status   Specimen Description BLOOD LAC  Final   Special Requests   Final    BOTTLES DRAWN AEROBIC AND ANAEROBIC Blood Culture adequate volume   Culture NO GROWTH 1 DAY  Final   Report Status PENDING  Incomplete  Blood culture (routine x 2)     Status: None (Preliminary result)   Collection Time: 01/19/2017  5:22 AM  Result Value Ref Range Status   Specimen Description BLOOD RAC  Final   Special Requests   Final    BOTTLES DRAWN AEROBIC AND ANAEROBIC Blood Culture adequate volume   Culture NO GROWTH 1 DAY  Final   Report Status PENDING  Incomplete  Culture, respiratory (NON-Expectorated)     Status: None (Preliminary result)   Collection Time: 01/18/2017  9:07 AM  Result Value Ref Range Status   Specimen Description TRACHEAL ASPIRATE  Final   Special Requests NONE  Final   Gram Stain   Final    RARE WBC PRESENT,BOTH PMN AND MONONUCLEAR RARE GRAM POSITIVE COCCI RARE GRAM NEGATIVE RODS    Culture   Final    CULTURE REINCUBATED FOR BETTER GROWTH Performed at [facility], [place] [address] [place], [place] [address]    Report Status PENDING  Incomplete  MRSA PCR Screening     Status: None   Collection Time: 02/04/2017  1:00 PM  Result Value Ref Range Status   MRSA by PCR NEGATIVE NEGATIVE Final    Comment:        The GeneXpert MRSA Assay (FDA approved for NASAL specimens only), is one component of a comprehensive MRSA colonization surveillance program. It is not intended to diagnose MRSA infection nor to guide or monitor treatment for MRSA infections.     Coagulation Studies:  Recent Labs  01/31/2017 0914 01/30/2017 1550 01/24/17 0500  LABPROT 14.6 15.6* 16.7*  INR  1.13 1.23 1.34    Urinalysis: No results for input(s): COLORURINE, LABSPEC, PHURINE, GLUCOSEU, HGBUR, BILIRUBINUR, KETONESUR, PROTEINUR, UROBILINOGEN, NITRITE, LEUKOCYTESUR in the last 72 hours.  Invalid input(s): APPERANCEUR    Imaging: Ct Head Wo Contrast  Result Date: 01/26/2017 CLINICAL DATA:  Status post resuscitation. Patient found pulseless. Initial encounter. EXAM: CT HEAD WITHOUT CONTRAST TECHNIQUE: Contiguous axial images were obtained from the base of the  skull through the vertex without intravenous contrast. COMPARISON:  None. FINDINGS: Brain: No evidence of acute infarction, hemorrhage, hydrocephalus, extra-axial collection or mass lesion/mass effect. Small chronic lacunar infarcts are seen at the basal ganglia bilaterally. The posterior fossa, including the cerebellum, brainstem and fourth ventricle, is within normal limits. The third and lateral ventricles are unremarkable in appearance. The cerebral hemispheres are symmetric in appearance, with normal gray-white differentiation. No mass effect or midline shift is seen. Vascular: No hyperdense vessel or unexpected calcification. Skull: There is no evidence of fracture; visualized osseous structures are unremarkable in appearance. Sinuses/Orbits: The visualized portions of the orbits are within normal limits. There is partial opacification of the sphenoid sinus with fluid. The remaining paranasal sinuses and mastoid air cells are well-aerated. Other: No significant soft tissue abnormalities are seen. IMPRESSION: 1. No acute intracranial pathology seen on CT. 2. 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 3. Partial opacification of the sphenoid sinus. Electronically Signed   By: Garald Balding M.D.   On: 01/31/2017 06:29   Dg Chest Port 1 View  Result Date: 01/26/2017 CLINICAL DATA:  Central line placement, recent cardiac arrest, intubated patient. EXAM: PORTABLE CHEST 1 VIEW COMPARISON:  Portable chest x-ray of  January 23, 2017 at 5:11 a.m. FINDINGS: The patient has undergone placement of a left internal jugular venous catheter. The tip projects over the midportion of the SVC. There is no postprocedure pneumothorax. The interstitial markings remain increased bilaterally greatest in the upper lobes where area of confluent density on the right has developed. The cardiac silhouette remains enlarged. The pulmonary vascularity is engorged and indistinct. External pacemaker defibrillator pads are present. The endotracheal tube tip lies approximately 8.8 cm above the carina. The tip is at this. Our margin of the clavicular heads. IMPRESSION: No postprocedure complication following left internal jugular venous catheter placement. High positioning of the endotracheal tube. Advancement by 4 cm is recommended. CHF with mild interstitial edema. Developing alveolar opacity in the right upper lobe may reflect pulmonary edema or pneumonia. Electronically Signed   By: David  Martinique M.D.   On: 01/31/2017 08:19   Dg Chest Portable 1 View  Result Date: 01/17/2017 CLINICAL DATA:  Endotracheal tube placement. Status post resuscitation. Initial encounter. EXAM: PORTABLE CHEST 1 VIEW COMPARISON:  None. FINDINGS: The patient's endotracheal tube is seen ending 10 cm above the carina. This could be advanced 6 cm. Vascular congestion is noted. Increased interstitial markings raise concern for mild pulmonary edema. No pleural effusion or pneumothorax is seen. The cardiomediastinal silhouette is enlarged. External pacing pads are noted. No acute osseous abnormalities are seen. IMPRESSION: 1. Endotracheal tube seen ending 10 cm above the carina. This could be advanced 6 cm, as deemed clinically appropriate. 2. Vascular congestion and cardiomegaly. Increased interstitial markings raise concern for mild pulmonary edema. Electronically Signed   By: Garald Balding M.D.   On: 02/04/2017 05:36   Dg Abd Portable 1v  Result Date: 01/17/2017 CLINICAL DATA:   Nasogastric tube placement. EXAM: PORTABLE ABDOMEN - 1 VIEW COMPARISON:  Radiograph of same day. FINDINGS: The bowel gas pattern is normal. No radio-opaque calculi or other significant radiographic abnormality are seen. Nasogastric tube tip is seen in body of stomach. IMPRESSION: Nasogastric tube tip seen in gastric lumen. No evidence of bowel obstruction or ileus. Electronically Signed   By: Marijo Conception, M.D.   On: 01/31/2017 14:47   Dg Abd Portable 1v  Result Date: 02/06/2017 CLINICAL DATA:  Status post NG tube placement today.  EXAM: PORTABLE ABDOMEN - 1 VIEW COMPARISON:  None. FINDINGS: No NG tube is identified. Mild gaseous distention of the stomach noted. IMPRESSION: NG tube is not visualized. Electronically Signed   By: Inge Rise M.D.   On: 01/20/2017 13:42      Assessment & Plan: Mr. Nam Vossler is a 61 y.o. black male with morbid obesity, hyperlipidemia, gout, hypertension, diabetes mellitus type II, peripheral vascular disease, coronary artery disease, who was admitted to [facility] on 02/02/2017 for Cardiac arrest [facility]) [I46.9] Cardiopulmonary arrest ([place]) [I46.9] Lactic acidosis [E87.2] Respiratory acidosis [E87.2] Acute pulmonary edema ([place]) [J81.0]  1. Acute renal failure with Metabolic acidosis: anuric. Baseline creatinine 1.2 on 01/15/17. Normal GFR.  - Patient will need renal replacement therapy. Discussed case with critical care team.   2. Cardiogenic shock: requiring vasopressors.  - vasopressin and norepinephrine.  - Appreciate cardiology input.   3. Respiratory failure: mechanical ventilation  4. Pneumonia:  Empiric pip/tazo and vanco   LOS: [place], [place] 6/13/201811:39 AM

## 2017-01-25 ENCOUNTER — Inpatient Hospital Stay: Payer: BLUE CROSS/BLUE SHIELD

## 2017-01-25 DIAGNOSIS — G931 Anoxic brain damage, not elsewhere classified: Secondary | ICD-10-CM

## 2017-01-25 LAB — BLOOD GAS, ARTERIAL
Acid-base deficit: 0.2 mmol/L (ref 0.0–2.0)
Bicarbonate: 26.4 mmol/L (ref 20.0–28.0)
FIO2: 0.6
MECHANICAL RATE: 18
O2 SAT: 93 %
PEEP/CPAP: 5 cmH2O
Patient temperature: 37
VT: 550 mL
pCO2 arterial: 50 mmHg — ABNORMAL HIGH (ref 32.0–48.0)
pH, Arterial: 7.33 — ABNORMAL LOW (ref 7.350–7.450)
pO2, Arterial: 72 mmHg — ABNORMAL LOW (ref 83.0–108.0)

## 2017-01-25 LAB — BASIC METABOLIC PANEL
ANION GAP: 13 (ref 5–15)
Anion gap: 16 — ABNORMAL HIGH (ref 5–15)
Anion gap: 18 — ABNORMAL HIGH (ref 5–15)
BUN: 43 mg/dL — ABNORMAL HIGH (ref 6–20)
BUN: 44 mg/dL — AB (ref 6–20)
BUN: 51 mg/dL — ABNORMAL HIGH (ref 6–20)
CALCIUM: 6.3 mg/dL — AB (ref 8.9–10.3)
CALCIUM: 6.7 mg/dL — AB (ref 8.9–10.3)
CHLORIDE: 93 mmol/L — AB (ref 101–111)
CO2: 27 mmol/L (ref 22–32)
CO2: 29 mmol/L (ref 22–32)
CO2: 26 mmol/L (ref 22–32)
CREATININE: 4.11 mg/dL — AB (ref 0.61–1.24)
Calcium: 6.5 mg/dL — ABNORMAL LOW (ref 8.9–10.3)
Chloride: 95 mmol/L — ABNORMAL LOW (ref 101–111)
Chloride: 96 mmol/L — ABNORMAL LOW (ref 101–111)
Creatinine, Ser: 4.31 mg/dL — ABNORMAL HIGH (ref 0.61–1.24)
Creatinine, Ser: 4.58 mg/dL — ABNORMAL HIGH (ref 0.61–1.24)
GFR calc Af Amer: 16 mL/min — ABNORMAL LOW (ref >60)
GFR calc non Af Amer: 14 mL/min — ABNORMAL LOW (ref >60)
GFR calc non Af Amer: 13 mL/min — ABNORMAL LOW (ref >60)
GFR, EST AFRICAN AMERICAN: 17 mL/min — AB (ref >60)
GFR, EST AFRICAN AMERICAN: 15 mL/min — AB (ref >60)
GFR, EST NON AFRICAN AMERICAN: 14 mL/min — AB (ref >60)
GLUCOSE: 112 mg/dL — AB (ref 65–99)
GLUCOSE: 93 mg/dL (ref 65–99)
Glucose, Bld: 122 mg/dL — ABNORMAL HIGH (ref 65–99)
POTASSIUM: 6.1 mmol/L — AB (ref 3.5–5.1)
Potassium: 5.4 mmol/L — ABNORMAL HIGH (ref 3.5–5.1)
Potassium: 4.9 mmol/L (ref 3.5–5.1)
SODIUM: 138 mmol/L (ref 135–145)
Sodium: 138 mmol/L (ref 135–145)
Sodium: 137 mmol/L (ref 135–145)

## 2017-01-25 LAB — CULTURE, RESPIRATORY

## 2017-01-25 LAB — CULTURE, RESPIRATORY W GRAM STAIN

## 2017-01-25 LAB — PROCALCITONIN: Procalcitonin: >150 ng/mL

## 2017-01-25 LAB — POTASSIUM: Potassium: 5.5 mmol/L — ABNORMAL HIGH (ref 3.5–5.1)

## 2017-01-25 LAB — CBC
HCT: 38.9 % — ABNORMAL LOW (ref 40.0–52.0)
Hemoglobin: 12.5 g/dL — ABNORMAL LOW (ref 13.0–18.0)
MCH: 25.1 pg — ABNORMAL LOW (ref 26.0–34.0)
MCHC: 32.1 g/dL (ref 32.0–36.0)
MCV: 78.2 fL — AB (ref 80.0–100.0)
PLATELETS: 94 10*3/uL — AB (ref 150–440)
RBC: 4.98 MIL/uL (ref 4.40–5.90)
RDW: 17 % — AB (ref 11.5–14.5)
WBC: 10 10*3/uL (ref 3.8–10.6)

## 2017-01-25 LAB — MAGNESIUM: Magnesium: 2.1 mg/dL (ref 1.7–2.4)

## 2017-01-25 LAB — GLUCOSE, CAPILLARY
Glucose-Capillary: 108 mg/dL — ABNORMAL HIGH (ref 65–99)
Glucose-Capillary: 161 mg/dL — ABNORMAL HIGH (ref 65–99)

## 2017-01-25 LAB — VANCOMYCIN, RANDOM: Vancomycin Rm: 48

## 2017-01-25 MED ORDER — SODIUM CHLORIDE 0.9 % IV SOLN
1.0000 g | Freq: Once | INTRAVENOUS | Status: AC
  Administered 2017-01-25: 1 g via INTRAVENOUS
  Filled 2017-01-25: qty 10

## 2017-01-25 MED ORDER — ONDANSETRON 4 MG PO TBDP: 4.0000 mg | ORAL_TABLET | Freq: Four times a day (QID) | ORAL | Status: DC | PRN

## 2017-01-25 MED ORDER — INSULIN REGULAR HUMAN 100 UNIT/ML IJ SOLN
10.0000 [IU] | Freq: Once | INTRAMUSCULAR | Status: DC
  Filled 2017-01-25: qty 0.1

## 2017-01-25 MED ORDER — ALBUTEROL SULFATE (2.5 MG/3ML) 0.083% IN NEBU: 2.5000 mg | INHALATION_SOLUTION | RESPIRATORY_TRACT | Status: DC | PRN

## 2017-01-25 MED ORDER — DEXTROSE 50 % IV SOLN
50.0000 mL | Freq: Once | INTRAVENOUS | Status: AC
  Administered 2017-01-25: 50 mL via INTRAVENOUS

## 2017-01-25 MED ORDER — POLYVINYL ALCOHOL 1.4 % OP SOLN
1.0000 [drp] | Freq: Four times a day (QID) | OPHTHALMIC | Status: DC | PRN
  Filled 2017-01-25: qty 15

## 2017-01-25 MED ORDER — MORPHINE 100MG IN NS 100ML (1MG/ML) PREMIX INFUSION: 5.0000 mg/h | INTRAVENOUS | Status: DC

## 2017-01-25 MED ORDER — DEXTROSE 50 % IV SOLN: 25.0000 mL | Freq: Once | INTRAVENOUS | Status: DC

## 2017-01-25 MED ORDER — HALOPERIDOL LACTATE 2 MG/ML PO CONC
0.5000 mg | ORAL | Status: DC | PRN
  Filled 2017-01-25: qty 0.3

## 2017-01-25 MED ORDER — INSULIN REGULAR HUMAN 100 UNIT/ML IJ SOLN
10.0000 [IU] | Freq: Once | INTRAMUSCULAR | Status: AC
  Administered 2017-01-25: 10 [IU] via INTRAVENOUS
  Filled 2017-01-25: qty 0.1

## 2017-01-25 MED ORDER — HALOPERIDOL 0.5 MG PO TABS
0.5000 mg | ORAL_TABLET | ORAL | Status: DC | PRN
  Filled 2017-01-25: qty 1

## 2017-01-25 MED ORDER — ACETAMINOPHEN 325 MG PO TABS: 650.0000 mg | ORAL_TABLET | Freq: Four times a day (QID) | ORAL | Status: DC | PRN

## 2017-01-25 MED ORDER — FENTANYL BOLUS VIA INFUSION
50.0000 ug | INTRAVENOUS | Status: DC | PRN
  Filled 2017-01-25: qty 200

## 2017-01-25 MED ORDER — ONDANSETRON HCL 4 MG/2ML IJ SOLN: 4.0000 mg | Freq: Four times a day (QID) | INTRAMUSCULAR | Status: DC | PRN

## 2017-01-25 MED ORDER — ACETAMINOPHEN 650 MG RE SUPP: 650.0000 mg | Freq: Four times a day (QID) | RECTAL | Status: DC | PRN

## 2017-01-25 MED ORDER — GLYCOPYRROLATE 1 MG PO TABS: 1.0000 mg | ORAL_TABLET | ORAL | Status: DC | PRN

## 2017-01-25 MED ORDER — GLYCOPYRROLATE 0.2 MG/ML IJ SOLN: 0.2000 mg | INTRAMUSCULAR | Status: DC | PRN

## 2017-01-25 MED ORDER — DEXTROSE 50 % IV SOLN
25.0000 mL | Freq: Once | INTRAVENOUS | Status: AC
  Administered 2017-01-25: 25 mL via INTRAVENOUS

## 2017-01-25 MED ORDER — HALOPERIDOL LACTATE 5 MG/ML IJ SOLN: 0.5000 mg | INTRAMUSCULAR | Status: DC | PRN

## 2017-01-25 MED ORDER — LORAZEPAM 2 MG/ML IJ SOLN: 2.0000 mg | INTRAMUSCULAR | Status: DC | PRN

## 2017-01-25 MED ORDER — DEXTROSE 50 % IV SOLN
INTRAVENOUS | Status: AC
  Administered 2017-01-25: 50 mL
  Filled 2017-01-25: qty 50

## 2017-01-25 MED ORDER — MORPHINE BOLUS VIA INFUSION: 2.0000 mg | INTRAVENOUS | Status: DC | PRN

## 2017-01-25 MED ORDER — FENTANYL 2500MCG IN NS 250ML (10MCG/ML) PREMIX INFUSION
100.0000 ug/h | INTRAVENOUS | Status: DC
  Administered 2017-01-25: 100 ug/h via INTRAVENOUS

## 2017-01-25 MED ORDER — CEFAZOLIN SODIUM-DEXTROSE 2-4 GM/100ML-% IV SOLN
2.0000 g | Freq: Two times a day (BID) | INTRAVENOUS | Status: DC
  Administered 2017-01-25: 2 g via INTRAVENOUS
  Filled 2017-01-25 (×2): qty 100

## 2017-01-25 MED FILL — Medication: Qty: 1 | Status: AC

## 2017-01-27 LAB — BLOOD CULTURE ID PANEL (REFLEXED)
ACINETOBACTER BAUMANNII: NOT DETECTED
CANDIDA GLABRATA: NOT DETECTED
CANDIDA PARAPSILOSIS: NOT DETECTED
CANDIDA TROPICALIS: NOT DETECTED
Candida albicans: NOT DETECTED
Candida krusei: NOT DETECTED
ENTEROBACTER CLOACAE COMPLEX: NOT DETECTED
Enterobacteriaceae species: NOT DETECTED
Enterococcus species: NOT DETECTED
Escherichia coli: NOT DETECTED
Haemophilus influenzae: NOT DETECTED
KLEBSIELLA PNEUMONIAE: NOT DETECTED
Klebsiella oxytoca: NOT DETECTED
Listeria monocytogenes: NOT DETECTED
NEISSERIA MENINGITIDIS: NOT DETECTED
PROTEUS SPECIES: NOT DETECTED
Pseudomonas aeruginosa: NOT DETECTED
STAPHYLOCOCCUS SPECIES: NOT DETECTED
STREPTOCOCCUS AGALACTIAE: NOT DETECTED
Serratia marcescens: NOT DETECTED
Staphylococcus aureus (BCID): NOT DETECTED
Streptococcus pneumoniae: NOT DETECTED
Streptococcus pyogenes: NOT DETECTED
Streptococcus species: NOT DETECTED

## 2017-01-28 LAB — CULTURE, BLOOD (ROUTINE X 2)
Culture: NO GROWTH
Special Requests: ADEQUATE

## 2017-01-29 ENCOUNTER — Telehealth: Payer: Self-pay | Admitting: Internal Medicine

## 2017-01-29 LAB — GLUCOSE, CAPILLARY
GLUCOSE-CAPILLARY: 31 mg/dL — AB (ref 65–99)
GLUCOSE-CAPILLARY: 37 mg/dL — AB (ref 65–99)
GLUCOSE-CAPILLARY: 51 mg/dL — AB (ref 65–99)
GLUCOSE-CAPILLARY: 53 mg/dL — AB (ref 65–99)
Glucose-Capillary: 549 mg/dL (ref 65–99)
Glucose-Capillary: 61 mg/dL — ABNORMAL LOW (ref 65–99)
Glucose-Capillary: 49 mg/dL — ABNORMAL LOW (ref 65–99)
Glucose-Capillary: 50 mg/dL — ABNORMAL LOW (ref 65–99)
Glucose-Capillary: <10 mg/dL — CL (ref 65–99)
Glucose-Capillary: <10 mg/dL — CL (ref 65–99)

## 2017-01-29 LAB — CULTURE, BLOOD (ROUTINE X 2): SPECIAL REQUESTS: ADEQUATE

## 2017-01-29 NOTE — Telephone Encounter (Signed)
[facility] informed death cert is ready for pick up. placed up front. Nothing further needed.

## 2017-01-29 NOTE — Telephone Encounter (Signed)
Recieved Death Certificate from [facility] Delivered To Wayne RomansSonya [name], CMA_

## 2017-02-11 NOTE — Progress Notes (Signed)
CH responded to a PG for IC-16. RN informed me that the family has decided to extend the time of intubation for 24 hours. CH spent time with many family members who were gathered in the IC waiting area. Pt brother-in-law is a Optician, dispensingminister and providing primary spiritual care for the family. CH is available for follow up and support as needed.    02/10/2017 0900  Clinical Encounter Type  Visited With Patient  Visit Type Initial;Spiritual support;Critical Care  Referral From Nurse  Consult/Referral To Chaplain

## 2017-02-11 NOTE — Progress Notes (Signed)
[facility] Encounter Note  Patient: Wayne [name] / Admit Date: 02/09/2017 / Date of Encounter: 02-11-2017, 8:39 AM   Subjective: Patient is hemodynamically stable at this time but is requiring pressor support for this blood pressure. Patient has not shown any neurologic signs of movement but has been sedated. Patient is having no evidence of further episodes of ventricular fibrillation. Patient has severe multiorgan failure Echocardiogram shows severe segmental LV systolic dysfunction with posterior lateral hypokinesis and akinesis of the apex consistent with previous myocardial infarction and ejection fraction of 20% Current troponin levels suggest that this LV systolic dysfunction and myocardial infarction happened in the remote past but likely significant LV dysfunction and scarring has caused V. fib arrest Review of Systems:  Cannot assess Objective: Telemetry: Sinus tachycardia Physical Exam: Blood pressure 102/75, pulse 85, temperature 98.8 F (37.1 C), temperature source Core (Comment), resp. rate (!) 26, height _0  (1.702 m), weight 99.8 kg (220 lb 0.3 oz), SpO2 90 %. Body mass index is 34.46 kg/m. General: Well developed, well nourished, intubated Head: Normocephalic, atraumatic, sclera non-icteric, no xanthomas, nares are without discharge. Neck: No apparent masses Lungs: Normal respirations with no wheezes, no rhonchi, no rales , no crackles   Heart: Regular rate and rhythm, normal S1 S2, no murmur, no rub, no gallop, PMI is normal size and placement, carotid upstroke normal without bruit, jugular venous pressure normal Abdomen: Soft,   non-distended with normoactive bowel sounds. No hepatosplenomegaly. Abdominal aorta is normal size without bruit Extremities: 2+ edema, no clubbing, no cyanosis, no ulcers,  Peripheral: 2+ radial, 2+ femoral, 2+ dorsal pedal pulses Neuro: It is not Alert and oriented. Does not Moves all extremities spontaneously. Psych:  Does  not Responds to questions appropriately with a normal affect.   Intake/Output Summary (Last 24 hours) at February 11, 2017 0839 Last data filed at 02/11/17 0645  Gross per 24 hour  Intake          3520.88 ml  Output              360 ml  Net          3160.88 ml    Inpatient Medications:  . albuterol  2.5 mg Nebulization Q6H  . chlorhexidine gluconate (MEDLINE KIT)  15 mL Mouth Rinse BID  . cisatracurium  0.1 mg/kg Intravenous Once  . dextrose  25 mL Intravenous Once  . dextrose  25 mL Intravenous Once  . feeding supplement (PRO-STAT SUGAR FREE 64)  30 mL Per Tube TID  . feeding supplement (VITAL HIGH PROTEIN)  1,000 mL Per Tube Q24H  . heparin  5,000 Units Subcutaneous Q8H  . insulin aspart  2-6 Units Subcutaneous Q4H  . insulin regular  10 Units Intravenous Once  . mouth rinse  15 mL Mouth Rinse 10 times per day  . midazolam  2 mg Intravenous Once  . pantoprazole (PROTONIX) IV  40 mg Intravenous Daily  . polyvinyl alcohol  1 drop Both Eyes Q8H   Infusions:  . amiodarone 30 mg/hr (02/11/17 0645)  . cisatracurium (NIMBEX) infusion    . fentaNYL infusion INTRAVENOUS Stopped (02-11-2017 0520)  . midazolam (VERSED) infusion 1 mg/hr (02-11-17 0645)  . norepinephrine (LEVOPHED) Adult infusion 32.96 mcg/min (Feb 11, 2017 0645)  . piperacillin-tazobactam (ZOSYN)  IV 3.375 g (02/11/2017 5102)  .  sodium bicarbonate  infusion 1000 mL 75 mL/hr at 11-Feb-2017 0645  . sodium chloride     And  . sodium chloride     And  . sodium chloride    .  vasopressin (PITRESSIN) infusion - *FOR SHOCK* 0.03 Units/min (Feb 03, 2017 0645)    Labs:  Recent Labs  02/03/2017 0914  01/24/17 1756  02-03-17 0306 02-03-17 0528  NA 137  < > 137  < > 138 138  K 5.5*  < > 4.8  < > 5.4* 4.9  CL 104  < > 99*  < > 95* 96*  CO2 24  < > 25  < > 27 29  GLUCOSE 260*  < > 132*  < > 122* 112*  BUN 18  < > 35*  < > 43* 44*  CREATININE 1.86*  < > 3.22*  < > 4.11* 4.31*  CALCIUM 8.2*  < > 6.5*  < > 6.3* 6.7*  MG 3.7*  --  2.2  --   --   2.1  PHOS 5.2*  --   --   --   --   --   < > = values in this interval not displayed.  Recent Labs  02/04/2017 0914 01/24/17 0500  AST 143* 146*  ALT 70* 59  ALKPHOS 132* 59  BILITOT 0.6 1.0  PROT 6.6 5.2*  ALBUMIN 3.5 2.7*    Recent Labs  01/28/2017 0511  01/24/17 0500 02-03-17 0528  WBC 16.6*  < > 10.8* 10.0  NEUTROABS 8.3*  --   --   --   HGB 14.5  < > 14.8 12.5*  HCT 47.9  < > 46.8 38.9*  MCV 84.4  < > 80.7 78.2*  PLT 201  < > 154 94*  < > = values in this interval not displayed.  Recent Labs  01/22/2017 0511 01/24/17 0034  TROPONINI 0.07* 0.97*   Invalid input(s): POCBNP No results for input(s): HGBA1C in the last 72 hours.   Weights: Filed Weights   01/13/2017 0800 01/24/17 0447 Feb 03, 2017 0500  Weight: 100.5 kg (221 lb 9 oz) 98.1 kg (216 lb 4.3 oz) 99.8 kg (220 lb 0.3 oz)     Radiology/Studies:  Dg Chest 1 View  Result Date: Feb 03, 2017 CLINICAL DATA:  Dyspnea. EXAM: CHEST 1 VIEW COMPARISON:  01/20/2017 FINDINGS: Endotracheal tube remains 8.8 cm above the carina at the superior margin of the clavicular heads. Left jugular catheter overlies the mid SVC. Enteric tube courses into the left upper abdomen with tip not imaged. The cardiac silhouette remains mildly enlarged. Right greater than left upper lobe opacities have largely resolved. There are mild persistent bibasilar airspace opacities. No overt pulmonary edema, sizable pleural effusion, or pneumothorax is identified. IMPRESSION: 1. Unchanged endotracheal tube and left jugular catheter as above. 2. Improved aeration of the upper lobes. Persistent bibasilar atelectasis or infiltrate. Electronically Signed   By: Logan Bores M.D.   On: 02-03-17 07:36   Ct Head Wo Contrast  Result Date: 02/10/2017 CLINICAL DATA:  Status post resuscitation. Patient found pulseless. Initial encounter. EXAM: CT HEAD WITHOUT CONTRAST TECHNIQUE: Contiguous axial images were obtained from the base of the skull through the vertex without  intravenous contrast. COMPARISON:  None. FINDINGS: Brain: No evidence of acute infarction, hemorrhage, hydrocephalus, extra-axial collection or mass lesion/mass effect. Small chronic lacunar infarcts are seen at the basal ganglia bilaterally. The posterior fossa, including the cerebellum, brainstem and fourth ventricle, is within normal limits. The third and lateral ventricles are unremarkable in appearance. The cerebral hemispheres are symmetric in appearance, with normal gray-white differentiation. No mass effect or midline shift is seen. Vascular: No hyperdense vessel or unexpected calcification. Skull: There is no evidence of fracture; visualized osseous structures are  unremarkable in appearance. Sinuses/Orbits: The visualized portions of the orbits are within normal limits. There is partial opacification of the sphenoid sinus with fluid. The remaining paranasal sinuses and mastoid air cells are well-aerated. Other: No significant soft tissue abnormalities are seen. IMPRESSION: 1. No acute intracranial pathology seen on CT. 2. 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 3. Partial opacification of the sphenoid sinus. Electronically Signed   By: Garald Balding M.D.   On: 02/06/2017 06:29   Dg Chest Port 1 View  Result Date: 02/08/2017 CLINICAL DATA:  Central line placement, recent cardiac arrest, intubated patient. EXAM: PORTABLE CHEST 1 VIEW COMPARISON:  Portable chest x-ray of January 23, 2017 at 5:11 a.m. FINDINGS: The patient has undergone placement of a left internal jugular venous catheter. The tip projects over the midportion of the SVC. There is no postprocedure pneumothorax. The interstitial markings remain increased bilaterally greatest in the upper lobes where area of confluent density on the right has developed. The cardiac silhouette remains enlarged. The pulmonary vascularity is engorged and indistinct. External pacemaker defibrillator pads are present. The endotracheal tube  tip lies approximately 8.8 cm above the carina. The tip is at this. Our margin of the clavicular heads. IMPRESSION: No postprocedure complication following left internal jugular venous catheter placement. High positioning of the endotracheal tube. Advancement by 4 cm is recommended. CHF with mild interstitial edema. Developing alveolar opacity in the right upper lobe may reflect pulmonary edema or pneumonia. Electronically Signed   By: David  Martinique M.D.   On: 02/03/2017 08:19   Dg Chest Portable 1 View  Result Date: 01/27/2017 CLINICAL DATA:  Endotracheal tube placement. Status post resuscitation. Initial encounter. EXAM: PORTABLE CHEST 1 VIEW COMPARISON:  None. FINDINGS: The patient's endotracheal tube is seen ending 10 cm above the carina. This could be advanced 6 cm. Vascular congestion is noted. Increased interstitial markings raise concern for mild pulmonary edema. No pleural effusion or pneumothorax is seen. The cardiomediastinal silhouette is enlarged. External pacing pads are noted. No acute osseous abnormalities are seen. IMPRESSION: 1. Endotracheal tube seen ending 10 cm above the carina. This could be advanced 6 cm, as deemed clinically appropriate. 2. Vascular congestion and cardiomegaly. Increased interstitial markings raise concern for mild pulmonary edema. Electronically Signed   By: Garald Balding M.D.   On: 02/04/2017 05:36   Dg Abd Portable 1v  Result Date: 01/12/2017 CLINICAL DATA:  Nasogastric tube placement. EXAM: PORTABLE ABDOMEN - 1 VIEW COMPARISON:  Radiograph of same day. FINDINGS: The bowel gas pattern is normal. No radio-opaque calculi or other significant radiographic abnormality are seen. Nasogastric tube tip is seen in body of stomach. IMPRESSION: Nasogastric tube tip seen in gastric lumen. No evidence of bowel obstruction or ileus. Electronically Signed   By: Marijo Conception, M.D.   On: 01/27/2017 14:47   Dg Abd Portable 1v  Result Date: 01/15/2017 CLINICAL DATA:  Status  post NG tube placement today. EXAM: PORTABLE ABDOMEN - 1 VIEW COMPARISON:  None. FINDINGS: No NG tube is identified. Mild gaseous distention of the stomach noted. IMPRESSION: NG tube is not visualized. Electronically Signed   By: Inge Rise M.D.   On: 01/20/2017 13:42     Assessment and Recommendation  61 y.o. male with known coronary artery disease essential hypertension and mixed hyperlipidemia diabetes with an acute arrest consistent with a ventricular arrhythmia likely causing arrest due to severe dilated cardiomyopathy and low ejection fraction and previous myocardial infarction without evidence of myocardial infarction at this  time 1. Continue supportive care after cardiopulmonary arrest with pressor support as able 2. No further cardiac diagnostics at this time until further evaluation neurologically 3. Poor prognosis due to the poor signs and multiorgan failure 4. Follow closely for neurologic improvements and further diagnostic cardiac testing if improves. 5. Call if further questions  Signed, Serafina Royals M.D. FACC

## 2017-02-11 NOTE — Death Summary Note (Signed)
DEATH SUMMARY   Patient Details  Name: Wayne [name] MRN: [id number] DOB: 19-Sep-1955  Admission/Discharge Information   Admit Date:  Jan 25, 2017  Date of Death:   Jan 27, 2017   Time of Death:  1448  Length of Stay: 2  Referring Physician: Marisue Ivan, MD   Reason(s) for Hospitalization  Acute MI and acute cardiac arrest  Diagnoses  Preliminary cause of death:  Secondary Diagnoses (including complications and co-morbidities):  Active Problems:   Cardiac arrest [facility]) acute MI anoxic brain injury Multiorgan failure Renal failure  Brief Hospital Course (including significant findings, care, treatment, and services provided and events leading to death)  Wayne [name] is a 61 y.o. year old male who was admitted to ICU for acute cardiac arrest S/p MI, s/p hypothermia protocol, vent support, vasopressor support Family has decided to make patient DNR status patient has very poor chance of meaningful recovery  After discussion with family, wife, they all have consented and agreed For Comfort care measures!!!    Pertinent Labs and Studies  Significant Diagnostic Studies Dg Chest 1 View  Result Date: 01/27/2017 CLINICAL DATA:  Dyspnea. EXAM: CHEST 1 VIEW COMPARISON:  01/18/2017 FINDINGS: Endotracheal tube remains 8.8 cm above the carina at the superior margin of the clavicular heads. Left jugular catheter overlies the mid SVC. Enteric tube courses into the left upper abdomen with tip not imaged. The cardiac silhouette remains mildly enlarged. Right greater than left upper lobe opacities have largely resolved. There are mild persistent bibasilar airspace opacities. No overt pulmonary edema, sizable pleural effusion, or pneumothorax is identified. IMPRESSION: 1. Unchanged endotracheal tube and left jugular catheter as above. 2. Improved aeration of the upper lobes. Persistent bibasilar atelectasis or infiltrate. Electronically Signed   By: Sebastian Ache M.D.   On: 01-27-17 07:36   Ct Head  Wo Contrast  Result Date: 25-Jan-2017 CLINICAL DATA:  Status post resuscitation. Patient found pulseless. Initial encounter. EXAM: CT HEAD WITHOUT CONTRAST TECHNIQUE: Contiguous axial images were obtained from the base of the skull through the vertex without intravenous contrast. COMPARISON:  None. FINDINGS: Brain: No evidence of acute infarction, hemorrhage, hydrocephalus, extra-axial collection or mass lesion/mass effect. Small chronic lacunar infarcts are seen at the basal ganglia bilaterally. The posterior fossa, including the cerebellum, brainstem and fourth ventricle, is within normal limits. The third and lateral ventricles are unremarkable in appearance. The cerebral hemispheres are symmetric in appearance, with normal gray-white differentiation. No mass effect or midline shift is seen. Vascular: No hyperdense vessel or unexpected calcification. Skull: There is no evidence of fracture; visualized osseous structures are unremarkable in appearance. Sinuses/Orbits: The visualized portions of the orbits are within normal limits. There is partial opacification of the sphenoid sinus with fluid. The remaining paranasal sinuses and mastoid air cells are well-aerated. Other: No significant soft tissue abnormalities are seen. IMPRESSION: 1. No acute intracranial pathology seen on CT. 2. 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 3. Partial opacification of the sphenoid sinus. Electronically Signed   By: Roanna Raider M.D.   On: 25-Jan-2017 06:29   Dg Chest Port 1 View  Result Date: Jan 25, 2017 CLINICAL DATA:  Central line placement, recent cardiac arrest, intubated patient. EXAM: PORTABLE CHEST 1 VIEW COMPARISON:  Portable chest x-ray of 25-Jan-2017 at 5:11 a.m. FINDINGS: The patient has undergone placement of a left internal jugular venous catheter. The tip projects over the midportion of the SVC. There is no postprocedure pneumothorax. The interstitial markings remain increased  bilaterally greatest in the upper lobes where  area of confluent density on the right has developed. The cardiac silhouette remains enlarged. The pulmonary vascularity is engorged and indistinct. External pacemaker defibrillator pads are present. The endotracheal tube tip lies approximately 8.8 cm above the carina. The tip is at this. Our margin of the clavicular heads. IMPRESSION: No postprocedure complication following left internal jugular venous catheter placement. High positioning of the endotracheal tube. Advancement by 4 cm is recommended. CHF with mild interstitial edema. Developing alveolar opacity in the right upper lobe may reflect pulmonary edema or pneumonia. Electronically Signed   By: David  [place] M.D.   On: 01/30/2017 08:19   Dg Chest Portable 1 View  Result Date: 02/02/2017 CLINICAL DATA:  Endotracheal tube placement. Status post resuscitation. Initial encounter. EXAM: PORTABLE CHEST 1 VIEW COMPARISON:  None. FINDINGS: The patient's endotracheal tube is seen ending 10 cm above the carina. This could be advanced 6 cm. Vascular congestion is noted. Increased interstitial markings raise concern for mild pulmonary edema. No pleural effusion or pneumothorax is seen. The cardiomediastinal silhouette is enlarged. External pacing pads are noted. No acute osseous abnormalities are seen. IMPRESSION: 1. Endotracheal tube seen ending 10 cm above the carina. This could be advanced 6 cm, as deemed clinically appropriate. 2. Vascular congestion and cardiomegaly. Increased interstitial markings raise concern for mild pulmonary edema. Electronically Signed   By: Roanna Raider M.D.   On: 01/15/2017 05:36   Dg Abd Portable 1v  Result Date: 01/18/2017 CLINICAL DATA:  Nasogastric tube placement. EXAM: PORTABLE ABDOMEN - 1 VIEW COMPARISON:  Radiograph of same day. FINDINGS: The bowel gas pattern is normal. No radio-opaque calculi or other significant radiographic abnormality are seen. Nasogastric tube tip is  seen in body of stomach. IMPRESSION: Nasogastric tube tip seen in gastric lumen. No evidence of bowel obstruction or ileus. Electronically Signed   By: Lupita Raider, M.D.   On: 01/12/2017 14:47   Dg Abd Portable 1v  Result Date: 01/22/2017 CLINICAL DATA:  Status post NG tube placement today. EXAM: PORTABLE ABDOMEN - 1 VIEW COMPARISON:  None. FINDINGS: No NG tube is identified. Mild gaseous distention of the stomach noted. IMPRESSION: NG tube is not visualized. Electronically Signed   By: Drusilla Kanner M.D.   On: 01/31/2017 13:42    Microbiology Recent Results (from the past 240 hour(s))  Blood culture (routine x 2)     Status: None (Preliminary result)   Collection Time: 01/30/2017  5:10 AM  Result Value Ref Range Status   Specimen Description BLOOD LAC  Final   Special Requests   Final    BOTTLES DRAWN AEROBIC AND ANAEROBIC Blood Culture adequate volume   Culture NO GROWTH 2 DAYS  Final   Report Status PENDING  Incomplete  Blood culture (routine x 2)     Status: None (Preliminary result)   Collection Time: 02/10/2017  5:22 AM  Result Value Ref Range Status   Specimen Description BLOOD RAC  Final   Special Requests   Final    BOTTLES DRAWN AEROBIC AND ANAEROBIC Blood Culture adequate volume   Culture NO GROWTH 2 DAYS  Final   Report Status PENDING  Incomplete  Culture, respiratory (NON-Expectorated)     Status: None   Collection Time: 01/24/2017  9:07 AM  Result Value Ref Range Status   Specimen Description TRACHEAL ASPIRATE  Final   Special Requests NONE  Final   Gram Stain   Final    RARE WBC PRESENT,BOTH PMN AND MONONUCLEAR RARE GRAM POSITIVE COCCI RARE GRAM NEGATIVE  RODS Performed at [facility], [address] [place], [place] [address]    Culture ABUNDANT ESCHERICHIA COLI  Final   Report Status 2017/02/06 FINAL  Final   Organism ID, Bacteria ESCHERICHIA COLI  Final      Susceptibility   Escherichia coli - MIC*    AMPICILLIN 8 SENSITIVE Sensitive     CEFAZOLIN  <=4 SENSITIVE Sensitive     CEFEPIME <=1 SENSITIVE Sensitive     CEFTAZIDIME <=1 SENSITIVE Sensitive     CEFTRIAXONE <=1 SENSITIVE Sensitive     CIPROFLOXACIN <=0.25 SENSITIVE Sensitive     GENTAMICIN <=1 SENSITIVE Sensitive     IMIPENEM <=0.25 SENSITIVE Sensitive     TRIMETH/SULFA <=20 SENSITIVE Sensitive     AMPICILLIN/SULBACTAM 4 SENSITIVE Sensitive     PIP/TAZO <=4 SENSITIVE Sensitive     Extended ESBL NEGATIVE Sensitive     * ABUNDANT ESCHERICHIA COLI  MRSA PCR Screening     Status: None   Collection Time: 01/26/2017  1:00 PM  Result Value Ref Range Status   MRSA by PCR NEGATIVE NEGATIVE Final    Comment:        The GeneXpert MRSA Assay (FDA approved for NASAL specimens only), is one component of a comprehensive MRSA colonization surveillance program. It is not intended to diagnose MRSA infection nor to guide or monitor treatment for MRSA infections.     Lab Basic Metabolic Panel:  Recent Labs Lab 01/21/2017 0914  01/24/17 1756 01/24/17 2256 06-Feb-2017 0107 Feb 06, 2017 0306 02/06/2017 0528 02/06/2017 1130  NA 137  < > 137 138  --  138 138 137  K 5.5*  < > 4.8 5.8* 5.5* 5.4* 4.9 6.1*  CL 104  < > 99* 98*  --  95* 96* 93*  CO2 24  < > 25 27  --  27 29 26   GLUCOSE 260*  < > 132* 129*  --  122* 112* 93  BUN 18  < > 35* 42*  --  43* 44* 51*  CREATININE 1.86*  < > 3.22* 3.84*  --  4.11* 4.31* 4.58*  CALCIUM 8.2*  < > 6.5* 6.6*  --  6.3* 6.7* 6.5*  MG 3.7*  --  2.2  --   --   --  2.1  --   PHOS 5.2*  --   --   --   --   --   --   --   < > = values in this interval not displayed. Liver Function Tests:  Recent Labs Lab 02/05/2017 0511 01/12/2017 0914 01/24/17 0500  AST 95* 143* 146*  ALT 63 70* 59  ALKPHOS 93 132* 59  BILITOT 0.6 0.6 1.0  PROT 7.0 6.6 5.2*  ALBUMIN 3.8 3.5 2.7*   No results for input(s): LIPASE, AMYLASE in the last 168 hours. No results for input(s): AMMONIA in the last 168 hours. CBC:  Recent Labs Lab 01/31/2017 0511 01/29/2017 0914 01/24/17 0500  February 06, 2017 0528  WBC 16.6* 20.5* 10.8* 10.0  NEUTROABS 8.3*  --   --   --   HGB 14.5 14.6 14.8 12.5*  HCT 47.9 48.2 46.8 38.9*  MCV 84.4 81.5 80.7 78.2*  PLT 201 362 154 94*   Cardiac Enzymes:  Recent Labs Lab 01/17/2017 0511 01/24/17 0034  TROPONINI 0.07* 0.97*   Sepsis Labs:  Recent Labs Lab 01/20/2017 0510 02/04/2017 0511 01/12/2017 0907 01/12/2017 0914 01/24/17 0500 01/24/17 1300 Feb 06, 2017 0528  PROCALCITON  --   --   --  1.62 71.48  --  >  150.00  WBC  --  16.6*  --  20.5* 10.8*  --  10.0  LATICACIDVEN 6.6*  --  3.9*  --   --  2.8*  --        Phillipa Morden 01/18/2017, 2:27 PM

## 2017-02-11 NOTE — Progress Notes (Signed)
Comfort care,extubated without complications 

## 2017-02-11 NOTE — Progress Notes (Signed)
CH responded to a OR for End of Life. Pt is intubated and no family is in the room. RN stated that the family will arrive later this morning for Extubation. CH requested a PG when the family arrives. RN agreed.    02/07/2017 0900  Clinical Encounter Type  Visited With Patient  Visit Type Initial;Spiritual support;Critical Care  Referral From Nurse  Consult/Referral To Chaplain

## 2017-02-11 NOTE — Progress Notes (Signed)
CH responded to a PG from the RN. The family has decided to proceed with ex-tubation. Pt was being prepared upon my arrival. Pt expired rapidly as the family entered the room. Son-In-Law minister is present and providing spiritual care.Seeing that they were very sad and also in a good space, I excused myself. CH is available for follow up if needed.    07/07/2017 1500  Clinical Encounter Type  Visited With Family  Visit Type Follow-up;Spiritual support;Critical Care;Death  Referral From Nurse  Consult/Referral To Faith community  Spiritual Encounters  Spiritual Needs Emotional;Grief support

## 2017-02-11 NOTE — Progress Notes (Signed)
Patient's wife (and family) decided to withdraw aggressive measures and transition to comfort care. Arctic sun pads removed. Fentanyl gtt was initiated at 100 mcg/hr. Patient was extubated at 1444 hours. All pressors and maintenance fluids were stopped at 1446 hours. Patient expired at 1448 hours w/ family at the bedside. Dr. Belia HemanKasa, CN, [facility], and CDS notified.

## 2017-02-11 NOTE — Consult Note (Signed)
PULMONARY / CRITICAL CARE MEDICINE   Name: Wayne [name] MRN: [id number] DOB: 18-Dec-1955    ADMISSION DATE:  01/31/2017   Subjective  Patient is sedated, on the ventilator.  REVIEW OF SYSTEMS:   Unable to assess pt intubated    VITAL SIGNS: BP 102/75   Pulse 85   Temp 98.8 F (37.1 C) (Core (Comment))   Resp (!) 26   Ht 5\' 7"  (1.702 m)   Wt 99.8 kg (220 lb 0.3 oz) Comment: artic sun pads in place  SpO2 90%   BMI 34.46 kg/m   HEMODYNAMICS: CVP:  [1 mmHg-18 mmHg] 10 mmHg  VENTILATOR SETTINGS: Vent Mode: PRVC FiO2 (%):  [40 %-100 %] 60 % Set Rate:  [18 bmp] 18 bmp Vt Set:  [550 mL] 550 mL PEEP:  [5 cmH20] 5 cmH20 Plateau Pressure:  [19 cmH20] 19 cmH20  INTAKE / OUTPUT: I/O last 3 completed shifts: In: 5114.4 [I.V.:4444.4; Other:160; NG/GT:60; IV Piggyback:450] Out: 465 [Urine:15; Emesis/NG output:450]  PHYSICAL EXAMINATION: General:  Acutely ill AA male in acute distress mechanically intubated Neuro: unresponsive, pupils equal, pinpoint, unreactive. No deep tendon reflexes elicited. Negative oculocephalic reflex. Negative gag reflex. No coughing on deep suctioning. The patient does breathe spontaneously over the ventilator. HEENT: supple, mild JVD Cardiovascular: nsr s1s2 with right BBB, no M/R/G Lungs: Decreased air entry bilaterally. Abdomen: +BS x4, distended, firm Musculoskeletal: normal bulk and tone no edema  Skin: intact no rashes or lesions   LABS:  BMET  Recent Labs Lab 01/24/17 2256 05/13/17 0107 05/13/17 0306 05/13/17 0528  NA 138  --  138 138  K 5.8* 5.5* 5.4* 4.9  CL 98*  --  95* 96*  CO2 27  --  27 29  BUN 42*  --  43* 44*  CREATININE 3.84*  --  4.11* 4.31*  GLUCOSE 129*  --  122* 112*    Electrolytes  Recent Labs Lab 02/08/2017 0914  01/24/17 1756 01/24/17 2256 05/13/17 0306 05/13/17 0528  CALCIUM 8.2*  < > 6.5* 6.6* 6.3* 6.7*  MG 3.7*  --  2.2  --   --  2.1  PHOS 5.2*  --   --   --   --   --   < > = values in this interval not  displayed.  CBC  Recent Labs Lab 01/22/2017 0914 01/24/17 0500 05/13/17 0528  WBC 20.5* 10.8* 10.0  HGB 14.6 14.8 12.5*  HCT 48.2 46.8 38.9*  PLT 362 154 94*    Coag's  Recent Labs Lab 01/28/2017 0914 01/24/2017 1550 01/24/17 0500  APTT 38* 30  --   INR 1.13 1.23 1.34    Sepsis Markers  Recent Labs Lab 01/13/2017 0510 02/09/2017 0907 01/16/2017 0914 01/24/17 0500 01/24/17 1300 05/13/17 0528  LATICACIDVEN 6.6* 3.9*  --   --  2.8*  --   PROCALCITON  --   --  1.62 71.48  --  >150.00    ABG  Recent Labs Lab 01/13/2017 0742 01/24/17 0234 05/13/17 0452  PHART 7.15* 7.24* 7.33*  PCO2ART 64* 33 50*  PO2ART 72* 188* 72*    Liver Enzymes  Recent Labs Lab 02/07/2017 0511 01/29/2017 0914 01/24/17 0500  AST 95* 143* 146*  ALT 63 70* 59  ALKPHOS 93 132* 59  BILITOT 0.6 0.6 1.0  ALBUMIN 3.8 3.5 2.7*    Cardiac Enzymes  Recent Labs Lab 01/27/2017 0511 01/24/17 0034  TROPONINI 0.07* 0.97*    Glucose  Recent Labs Lab 01/24/17 0940 01/24/17 1148 01/24/17 1608 01/24/17  2022 01/24/17 2352 01/12/2017 0414  GLUCAP 71 124* 152* 139* 115* 108*    Imaging Dg Chest 1 View  Result Date: 01/31/2017 CLINICAL DATA:  Dyspnea. EXAM: CHEST 1 VIEW COMPARISON:  01-Feb-2017 FINDINGS: Endotracheal tube remains 8.8 cm above the carina at the superior margin of the clavicular heads. Left jugular catheter overlies the mid SVC. Enteric tube courses into the left upper abdomen with tip not imaged. The cardiac silhouette remains mildly enlarged. Right greater than left upper lobe opacities have largely resolved. There are mild persistent bibasilar airspace opacities. No overt pulmonary edema, sizable pleural effusion, or pneumothorax is identified. IMPRESSION: 1. Unchanged endotracheal tube and left jugular catheter as above. 2. Improved aeration of the upper lobes. Persistent bibasilar atelectasis or infiltrate. Electronically Signed   By: Sebastian Ache M.D.   On: 01/14/2017 07:36    STUDIES:  CT Head 06/12>>No acute intracranial pathology seen on CT. 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 Partial opacification of the sphenoid sinus  CULTURES: Blood 06/12>>\ Respiratory>> 06/12: Escherichia coli, pansensitive  ANTIBIOTICS: Zosyn 06/12>> Vancomycin 06/12>>  SIGNIFICANT EVENTS: 06/12-Pt admitted to ICU post cardiac arrest mechanically intubated   LINES/TUBES: ETT 06/12>> CVL 06/12>>  ASSESSMENT / PLAN:  PULMONARY A: Acute hypercapnic respiratory failure secondary to pulmonary edema and aspiration pneumonia with Escherichia coli. Mechanical intubation s/p cardiac arrest  Hx: Former smoker  Blood gas 7.33/50/72/26.4 consistent with acute metabolic acidosis. P:   On Hypothermic protocol . Temperature maintenance phase.port  Maintain O2 sats >92% Scheduled and prn bronchodilator therapy  Vent Mode: PRVC FiO2 (%):  [40 %-100 %] 60 % Set Rate:  [18 bmp] 18 bmp Vt Set:  [550 mL] 550 mL PEEP:  [5 cmH20] 5 cmH20 Plateau Pressure:  [19 cmH20] 19 cmH20  CARDIOVASCULAR A:  Vfib Arrest of unknown etiology may be secondary to respiratory arrest  Mildly elevated troponin s/p cardiac arrest  Hx: Peripheral Vascular Disease, Myocardial Infarction, HTN, CAD, and Hyperlipidemia  Echo 01/24/17: EF equals 20% P:  Cardiology consulted appreciate input Continuous telemetry monitoring  Trend troponin's Echo pending  Amiodarone gtt  EKG per hypothermia protocol   RENAL A:   Worsening Acute renal failure with oliguria Hypokalemia  Lactic acidosis Metabolic acidosis  P:   No dialysis, secondary to very poor prognosis   GASTROINTESTINAL A:   Abdominal distention  P:   Orogastric tube @ LIS  Keep NPO for now   HEMATOLOGIC A:   No acute issues  P:  Trend CBC Monitor for s/sx of bleeding  Subq heparin for VTE prophylaxis  Transfuse for hgb <7 PT/INR per hypothermic protocol   INFECTIOUS A:   Possible aspiration pneumonia   Leukocytosis  P:   Trend WBC and monitor fever curve Trend PCT and lactic acid Continue empiric abx Follow cultures   ENDOCRINE A:   Hx: Diabetes Mellitus P:   CBG's q1hr during hypothermia protocol  NEUROLOGIC A:   Acute encephalopathy s/p cardiac arrest P:   RASS goal: -5 for duration of neuromuscular blockade therapy  Shivering assessment BSA goal <1 q1hr during hypothermia protocol Nimbex gtt, Versed gtt, and Fentanyl gtt to maintain RASS goal -5 during cooling process of hypothermia protocol Neurology consulted appreciate input EEG pending No wakeup assessment during hypothermia protocol   FAMILY  - Updates: Pts family updated regarding plan of care and all questions answered.the patient's neuro status continues to be very poor, he is essentially unresponsive, he does not appear to have recovered, and his potential for  recovery appears to be very poor, with an overall very poor prognosis. This was discussed with the patient's wife is in agreement that the patient would not want to be kept alive in this situation. We'll therefore plan for withdrawal of support. Later today.       Physical Exam   Deep Nicholos Johns, MD.   Board Certified in Internal Medicine, Pulmonary Medicine, Critical Care Medicine, and Sleep Medicine.  [facility] Office Number: [phone] Pager: [phone]  Santiago Glad, M.D.  Billy Fischer, M.D 2017/02/07  Critical Care Attestation.  I have personally obtained a history, examined the patient, evaluated laboratory and imaging results, formulated the assessment and plan and placed orders. The Patient requires high complexity decision making for assessment and support, frequent evaluation and titration of therapies, application of advanced monitoring technologies and extensive interpretation of multiple databases. The patient has critical illness that could lead imminently to failure of 1 or more organ systems and  requires the highest level of physician preparedness to intervene.  Critical Care Time devoted to patient care services described in this note is 30 minutes and is exclusive of time spent in procedures supervisory time of NP.

## 2017-02-11 NOTE — Progress Notes (Signed)
Patient reached 37 degrees at 0515 sedation titrated down and off respirations irregular with little improvement from respiratory interventions. Versed restarted at 1 mg with 2 mg bolus administered. Additional changes noted during warming include palpable pulses and pupillary reaction, pupils 1 to 2 in size round and sluggish on the left brisk on the right. Urine output 8 mL for the night. Patient remains on vasopressors as ordered. Patient wife and sister called to check on patient and was updated on patient status after warming completed.

## 2017-02-11 NOTE — Progress Notes (Signed)
[facility]  ROUNDING NOTE   Subjective:   UOP 0 Family has decided to withdraw care today  Objective:  Vital signs in last 24 hours:  Temp:  [90 F (32.2 C)-98.8 F (37.1 C)] 98.8 F (37.1 C) (06/14 0500) Pulse Rate:  [74-85] 85 (06/14 0400) Resp:  [10-26] 26 (06/14 0645) BP: (82-115)/(24-104) 102/75 (06/14 0645) SpO2:  [88 %-98 %] 90 % (06/14 0415) FiO2 (%):  [40 %-100 %] 60 % (06/14 0755) Weight:  [99.8 kg (220 lb 0.3 oz)] 99.8 kg (220 lb 0.3 oz) (06/14 0500)  Weight change: -0.7 kg (-1 lb 8.7 oz) Filed Weights   01/18/2017 0800 01/24/17 0447 10-Feb-2017 0500  Weight: 100.5 kg (221 lb 9 oz) 98.1 kg (216 lb 4.3 oz) 99.8 kg (220 lb 0.3 oz)    Intake/Output: I/O last 3 completed shifts: In: 5114.4 [I.V.:4444.4; Other:160; NG/GT:60; IV Piggyback:450] Out: 465 [Urine:15; Emesis/NG output:450]   Intake/Output this shift:  No intake/output data recorded.  Physical Exam: General: Critically ill   Head: ETT  Eyes: Eyes closed  Neck: Supple, trachea midline  Lungs:  Clear to auscultation  Heart: Regular rate and rhythm  Abdomen:  Soft, nontender,   Extremities: no peripheral edema.  Neurologic: Intubated, sedated  Skin: No lesions  Access: none    Basic Metabolic Panel:  Recent Labs Lab 01/29/2017 0914  01/24/17 1523 01/24/17 1756 01/24/17 2256 02-10-2017 0107 2017/02/10 0306 2017-02-10 0528  NA 137  < > 137 137 138  --  138 138  K 5.5*  < > 5.1 4.8 5.8* 5.5* 5.4* 4.9  CL 104  < > 99* 99* 98*  --  95* 96*  CO2 24  < > 27 25 27   --  27 29  GLUCOSE 260*  < > 152* 132* 129*  --  122* 112*  BUN 18  < > 37* 35* 42*  --  43* 44*  CREATININE 1.86*  < > 3.38* 3.22* 3.84*  --  4.11* 4.31*  CALCIUM 8.2*  < > 6.8* 6.5* 6.6*  --  6.3* 6.7*  MG 3.7*  --   --  2.2  --   --   --  2.1  PHOS 5.2*  --   --   --   --   --   --   --   < > = values in this interval not displayed.  Liver Function Tests:  Recent Labs Lab 01/15/2017 0511 01/16/2017 0914 01/24/17 0500  AST  95* 143* 146*  ALT 63 70* 59  ALKPHOS 93 132* 59  BILITOT 0.6 0.6 1.0  PROT 7.0 6.6 5.2*  ALBUMIN 3.8 3.5 2.7*   No results for input(s): LIPASE, AMYLASE in the last 168 hours. No results for input(s): AMMONIA in the last 168 hours.  CBC:  Recent Labs Lab 01/24/2017 0511 02/04/2017 0914 01/24/17 0500 Feb 10, 2017 0528  WBC 16.6* 20.5* 10.8* 10.0  NEUTROABS 8.3*  --   --   --   HGB 14.5 14.6 14.8 12.5*  HCT 47.9 48.2 46.8 38.9*  MCV 84.4 81.5 80.7 78.2*  PLT 201 362 154 94*    Cardiac Enzymes:  Recent Labs Lab 01/31/2017 0511 01/24/17 0034  TROPONINI 0.07* 0.97*    BNP: Invalid input(s): POCBNP  CBG:  Recent Labs Lab 01/24/17 1148 01/24/17 1608 01/24/17 2022 01/24/17 2352 2017-02-10 0414  GLUCAP 124* 152* 139* 115* 108*    Microbiology: Results for orders placed or performed during the hospital encounter of 01/13/2017  Blood culture (  routine x 2)     Status: None (Preliminary result)   Collection Time: 01/27/2017  5:10 AM  Result Value Ref Range Status   Specimen Description BLOOD LAC  Final   Special Requests   Final    BOTTLES DRAWN AEROBIC AND ANAEROBIC Blood Culture adequate volume   Culture NO GROWTH 2 DAYS  Final   Report Status PENDING  Incomplete  Blood culture (routine x 2)     Status: None (Preliminary result)   Collection Time: 01/16/2017  5:22 AM  Result Value Ref Range Status   Specimen Description BLOOD RAC  Final   Special Requests   Final    BOTTLES DRAWN AEROBIC AND ANAEROBIC Blood Culture adequate volume   Culture NO GROWTH 2 DAYS  Final   Report Status PENDING  Incomplete  Culture, respiratory (NON-Expectorated)     Status: None   Collection Time: 01/17/2017  9:07 AM  Result Value Ref Range Status   Specimen Description TRACHEAL ASPIRATE  Final   Special Requests NONE  Final   Gram Stain   Final    RARE WBC PRESENT,BOTH PMN AND MONONUCLEAR RARE GRAM POSITIVE COCCI RARE GRAM NEGATIVE RODS Performed at [facility], [place] [address]  [place], [place] [address]    Culture ABUNDANT ESCHERICHIA COLI  Final   Report Status 02/04/2017 FINAL  Final   Organism ID, Bacteria ESCHERICHIA COLI  Final      Susceptibility   Escherichia coli - MIC*    AMPICILLIN 8 SENSITIVE Sensitive     CEFAZOLIN <=4 SENSITIVE Sensitive     CEFEPIME <=1 SENSITIVE Sensitive     CEFTAZIDIME <=1 SENSITIVE Sensitive     CEFTRIAXONE <=1 SENSITIVE Sensitive     CIPROFLOXACIN <=0.25 SENSITIVE Sensitive     GENTAMICIN <=1 SENSITIVE Sensitive     IMIPENEM <=0.25 SENSITIVE Sensitive     TRIMETH/SULFA <=20 SENSITIVE Sensitive     AMPICILLIN/SULBACTAM 4 SENSITIVE Sensitive     PIP/TAZO <=4 SENSITIVE Sensitive     Extended ESBL NEGATIVE Sensitive     * ABUNDANT ESCHERICHIA COLI  MRSA PCR Screening     Status: None   Collection Time: 02/09/2017  1:00 PM  Result Value Ref Range Status   MRSA by PCR NEGATIVE NEGATIVE Final    Comment:        The GeneXpert MRSA Assay (FDA approved for NASAL specimens only), is one component of a comprehensive MRSA colonization surveillance program. It is not intended to diagnose MRSA infection nor to guide or monitor treatment for MRSA infections.     Coagulation Studies:  Recent Labs  02/05/2017 0914 02/06/2017 1550 01/24/17 0500  LABPROT 14.6 15.6* 16.7*  INR 1.13 1.23 1.34    Urinalysis:  Recent Labs  02/05/2017 0907  COLORURINE YELLOW*  LABSPEC 1.028  PHURINE 5.0  GLUCOSEU 50*  HGBUR MODERATE*  BILIRUBINUR NEGATIVE  KETONESUR 5*  PROTEINUR 30*  NITRITE NEGATIVE  LEUKOCYTESUR NEGATIVE      Imaging: Dg Chest 1 View  Result Date: February 04, 2017 CLINICAL DATA:  Dyspnea. EXAM: CHEST 1 VIEW COMPARISON:  01/22/2017 FINDINGS: Endotracheal tube remains 8.8 cm above the carina at the superior margin of the clavicular heads. Left jugular catheter overlies the mid SVC. Enteric tube courses into the left upper abdomen with tip not imaged. The cardiac silhouette remains mildly enlarged. Right greater than left upper  lobe opacities have largely resolved. There are mild persistent bibasilar airspace opacities. No overt pulmonary edema, sizable pleural effusion, or pneumothorax is identified. IMPRESSION:  1. Unchanged endotracheal tube and left jugular catheter as above. 2. Improved aeration of the upper lobes. Persistent bibasilar atelectasis or infiltrate. Electronically Signed   By: Logan Bores M.D.   On: Feb 21, 2017 07:36   Dg Abd Portable 1v  Result Date: 01/28/2017 CLINICAL DATA:  Nasogastric tube placement. EXAM: PORTABLE ABDOMEN - 1 VIEW COMPARISON:  Radiograph of same day. FINDINGS: The bowel gas pattern is normal. No radio-opaque calculi or other significant radiographic abnormality are seen. Nasogastric tube tip is seen in body of stomach. IMPRESSION: Nasogastric tube tip seen in gastric lumen. No evidence of bowel obstruction or ileus. Electronically Signed   By: Marijo Conception, M.D.   On: 02/09/2017 14:47   Dg Abd Portable 1v  Result Date: 02/07/2017 CLINICAL DATA:  Status post NG tube placement today. EXAM: PORTABLE ABDOMEN - 1 VIEW COMPARISON:  None. FINDINGS: No NG tube is identified. Mild gaseous distention of the stomach noted. IMPRESSION: NG tube is not visualized. Electronically Signed   By: Inge Rise M.D.   On: 01/22/2017 13:42     Medications:   . amiodarone 30 mg/hr (02-21-17 0645)  . cisatracurium (NIMBEX) infusion    . fentaNYL infusion INTRAVENOUS Stopped (2017/02/21 0520)  . midazolam (VERSED) infusion 1 mg/hr (February 21, 2017 0645)  . norepinephrine (LEVOPHED) Adult infusion 32.96 mcg/min (02-21-17 0645)  .  sodium bicarbonate  infusion 1000 mL 75 mL/hr at February 21, 2017 0645  . sodium chloride     And  . sodium chloride     And  . sodium chloride    . vasopressin (PITRESSIN) infusion - *FOR SHOCK* 0.03 Units/min (02/21/2017 0645)   . albuterol  2.5 mg Nebulization Q6H  . chlorhexidine gluconate (MEDLINE KIT)  15 mL Mouth Rinse BID  . cisatracurium  0.1 mg/kg Intravenous Once  .  dextrose  25 mL Intravenous Once  . dextrose  25 mL Intravenous Once  . feeding supplement (PRO-STAT SUGAR FREE 64)  30 mL Per Tube TID  . feeding supplement (VITAL HIGH PROTEIN)  1,000 mL Per Tube Q24H  . heparin  5,000 Units Subcutaneous Q8H  . insulin aspart  2-6 Units Subcutaneous Q4H  . insulin regular  10 Units Intravenous Once  . mouth rinse  15 mL Mouth Rinse 10 times per day  . midazolam  2 mg Intravenous Once  . pantoprazole (PROTONIX) IV  40 mg Intravenous Daily  . polyvinyl alcohol  1 drop Both Eyes Q8H   acetaminophen **OR** acetaminophen, albuterol, cisatracurium **AND** cisatracurium (NIMBEX) infusion **AND** cisatracurium, fentaNYL, glycopyrrolate **OR** glycopyrrolate **OR** glycopyrrolate, haloperidol **OR** haloperidol **OR** haloperidol lactate, midazolam, ondansetron **OR** ondansetron (ZOFRAN) IV, polyvinyl alcohol, sodium chloride flush  Assessment/ Plan:  [name] is a 61 y.o.  male  with morbid obesity, hyperlipidemia, gout, hypertension, diabetes mellitus type II, peripheral vascular disease, coronary artery disease, who was admitted to [facility] on 01/26/2017   1. Acute renal failure with Metabolic acidosis: anuric. Baseline creatinine 1.2 on 01/15/17. Normal GFR.  - poor prognosis. Family is not interested in renal replacement therapy  2. Cardiogenic shock: requiring vasopressors.  - vasopressin and norepinephrine.  - Appreciate cardiology input.   3. Respiratory failure: mechanical ventilation  4. Pneumonia:  Empiric pip/tazo and vanco   LOS: [place], [place] 07/11/20189:09 AM

## 2017-02-11 NOTE — Progress Notes (Signed)
After further assessment, patient has very poor chance of meaningful recovery in setting of severe cardiac arrest with severe anoxic brain injury.  Patient has multiorgan failure, patient was made DNR this AM. I had discussion with family, wife regarding their wishes. The family has agreed and consented to Comfort Care Measures.  Will proceed with Comfort care order set.   Family are satisfied with Plan of action and management. All questions answered  Lucie LeatherKurian David Rosea Dory, M.D.  [facility]  Medical Director [facility] Medical Director [facility]

## 2017-02-11 NOTE — Progress Notes (Signed)
  Family Meeting.   Met with wife and immediate and extended family (10+) members. Explained lack of neurological progress and poor prognosis. Recommended comfort measures, wife had previously been in agreement with this, and was interested in change to comfort measures today.  Several family members were in disbelief, expressed grief, and asked why his lip was moving when they spoke with him.  Explained that his prognosis is grim. They requested up to 24 hours to allow other family members to come and see him.   Will continue DNR status.   -Marda Stalker, M.D.

## 2017-02-11 NOTE — Clinical Social Work Note (Signed)
CSW consulted for possible nursing home placement. It appears upon reviewing of medical record that patient's family may be withdrawing life support in the next 24 hours. Patient is not expected to survive if this is decided. CSW will hold assessment. York SpanielMonica Espen Bethel MSW,LCSW [phone]

## 2017-02-11 NOTE — Progress Notes (Signed)
[facility] [place] [facility]   PATIENT NAME: Wayne HugueninCarl Kozlowski    MR#:  [id number]  DATE OF BIRTH:  09-17-1955  SUBJECTIVE: Admitted for cardiac arrest, code ICE protocol is being followed.    CHIEF COMPLAINT:   Chief Complaint  Patient presents with  . CPR   Clinically not doing well today, family is considering comfort care measures REVIEW OF SYSTEMS:   Review of Systems  Unable to perform ROS: Acuity of condition    DRUG ALLERGIES:  No Known Allergies  VITALS:  Blood pressure 108/73, pulse 83, temperature 98.6 F (37 C), temperature source Core (Comment), resp. rate (!) 27, height 5\' 7"  (1.702 m), weight 99.8 kg (220 lb 0.3 oz), SpO2 95 %.  PHYSICAL EXAMINATION:  GENERAL:  61 y.o.-year-old patient lying in the bed , intubated, EYES: "But not reacting to light. HEENT: Head atraumatic, normocephalic. Intubated.  NECK:  Supple, no jugular venous distention. No thyroid enlargement, no tenderness.  LUNGS: Diminished breath sounds bilaterally, patient has .Coarsebreathsounds.  CARDIOVASCULAR: S1, S2 normal. No murmurs, rubs, or gallops.  ABDOMEN: Soft, , nondistended. Bowel sounds present. No organomegaly or mass.  EXTREMITIES: No pedal edema, cyanosis, or clubbing.  NEUROLOGIC: Intubated, status post cardiac arrest. Patient not on any neuromuscular blocking agents were still unresponsive. PSYCHIATRIC: intubated/sedated.  LABORATORY PANEL:   CBC  Recent Labs Lab 02/08/2017 0528  WBC 10.0  HGB 12.5*  HCT 38.9*  PLT 94*   ------------------------------------------------------------------------------------------------------------------  Chemistries   Recent Labs Lab 01/24/17 0500  02/05/2017 0528 01/16/2017 1130  NA 139  < > 138 137  K 3.9  < > 4.9 6.1*  CL 103  < > 96* 93*  CO2 27  < > 29 26  GLUCOSE 141*  < > 112* 93  BUN 31*  < > 44* 51*  CREATININE 2.92*  < > 4.31* 4.58*  CALCIUM 7.4*  < > 6.7* 6.5*  MG  --   < > 2.1  --   AST 146*   --   --   --   ALT 59  --   --   --   ALKPHOS 59  --   --   --   BILITOT 1.0  --   --   --   < > = values in this interval not displayed. ------------------------------------------------------------------------------------------------------------------  Cardiac Enzymes  Recent Labs Lab 01/24/17 0034  TROPONINI 0.97*   ------------------------------------------------------------------------------------------------------------------  RADIOLOGY:  Dg Chest 1 View  Result Date: 02/07/2017 CLINICAL DATA:  Dyspnea. EXAM: CHEST 1 VIEW COMPARISON:  01-01-2017 FINDINGS: Endotracheal tube remains 8.8 cm above the carina at the superior margin of the clavicular heads. Left jugular catheter overlies the mid SVC. Enteric tube courses into the left upper abdomen with tip not imaged. The cardiac silhouette remains mildly enlarged. Right greater than left upper lobe opacities have largely resolved. There are mild persistent bibasilar airspace opacities. No overt pulmonary edema, sizable pleural effusion, or pneumothorax is identified. IMPRESSION: 1. Unchanged endotracheal tube and left jugular catheter as above. 2. Improved aeration of the upper lobes. Persistent bibasilar atelectasis or infiltrate. Electronically Signed   By: Sebastian AcheAllen  Grady M.D.   On: 01/26/2017 07:36    EKG:   Orders placed or performed during the hospital encounter of 2017/04/11  . ED EKG  . ED EKG  . EKG 12-Lead  . EKG 12-Lead  . EKG 12-Lead  . EKG 12-Lead  . EKG 12-Lead  . EKG 12-Lead  . EKG 12-Lead  .  EKG 12-Lead  . EKG 12-Lead  . EKG 12-Lead  . EKG    ASSESSMENT AND PLAN:   #61 year old male patient with history of coronary artery disease, diabetes mellitus type 2, hyperlipidemia, hypertension, PVD found by wife unresponsive, patient had CPR, ACLS, 5 rounds of therapy, amiodarone, patient was in V. fib so he got shocked 3 times  and admitted to intensive care unit, code  ICE protocol followed. Clinically not improved #2  cardiac arrest ; prognosis really poor, #3 acute renal failure with metabolic acidosis, baseline creatinine 1.2, patient has no urine output  # cardiogenic shock./, V. fib.    Family members decided to change him to comfort care. Patient deceased on 2023/02/11  On comfort care measures  All the records are reviewed and case discussed with Care Management/Social Workerr. Management plans discussed with the patient, family and they are in agreement.  CODE STATUS: Comfort Care  TOTAL TIME TAKING CARE OF THIS PATIENT: .    Ramonita Lab M.D on 02/10/2017 at 3:28 PM  Between 7am to 6pm - Pager - [phone]  After 6pm go to [email or web address] - password [email or web address] [facility]  [facility]  Office  [phone]  CC: Primary care physician; Marisue Ivan, MD   Note: This dictation was prepared with Dragon dictation along with smaller phrase technology. Any transcriptional errors that result from this process are unintentional.

## 2017-02-11 DEATH — deceased

## 2017-07-13 ENCOUNTER — Ambulatory Visit (INDEPENDENT_AMBULATORY_CARE_PROVIDER_SITE_OTHER): Payer: BLUE CROSS/BLUE SHIELD | Admitting: Vascular Surgery

## 2017-07-13 ENCOUNTER — Encounter (INDEPENDENT_AMBULATORY_CARE_PROVIDER_SITE_OTHER): Payer: BLUE CROSS/BLUE SHIELD

## 2019-02-09 IMAGING — DX DG ABD PORTABLE 1V
1 series · 1 of 1 positions shown · non-contrast
Comparison: None.

CLINICAL DATA: Status post NG tube placement today.

EXAM:
PORTABLE ABDOMEN - 1 VIEW

[abdomen kub]
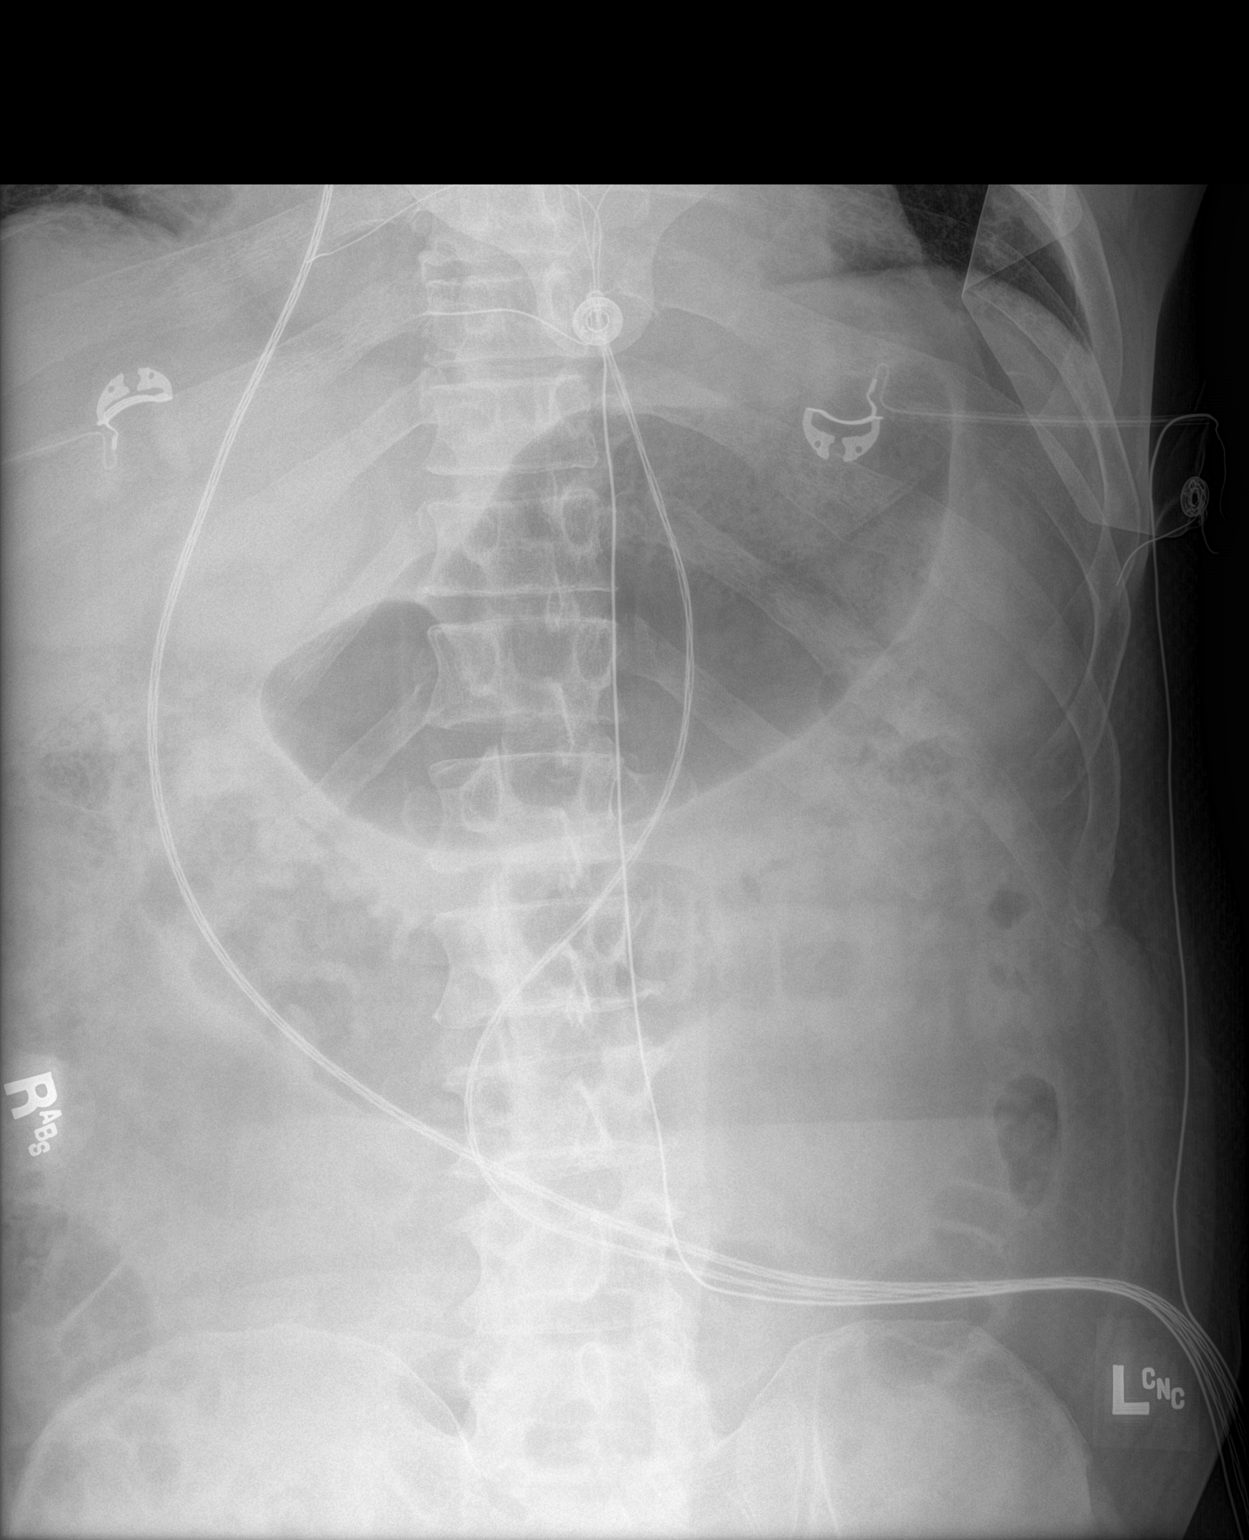

[1 of 1 positions shown; findings below may reference images not displayed]

FINDINGS: No NG tube is identified. Mild gaseous distention of the stomach
noted.
IMPRESSION: NG tube is not visualized.
